# Patient Record
Sex: Female | Born: 1960 | Race: White | Hispanic: No | Marital: Married | Smoking: Never smoker
Health system: Southern US, Community
[De-identification: ages and names within clinical notes are randomized; demographics above are authoritative.]

## PROBLEM LIST (undated history)

## (undated) DIAGNOSIS — K219 Gastro-esophageal reflux disease without esophagitis: Secondary | ICD-10-CM

## (undated) DIAGNOSIS — Z87442 Personal history of urinary calculi: Secondary | ICD-10-CM

## (undated) DIAGNOSIS — T8859XA Other complications of anesthesia, initial encounter: Secondary | ICD-10-CM

## (undated) DIAGNOSIS — T4145XA Adverse effect of unspecified anesthetic, initial encounter: Secondary | ICD-10-CM

## (undated) DIAGNOSIS — D34 Benign neoplasm of thyroid gland: Secondary | ICD-10-CM

## (undated) DIAGNOSIS — C801 Malignant (primary) neoplasm, unspecified: Secondary | ICD-10-CM

## (undated) DIAGNOSIS — D649 Anemia, unspecified: Secondary | ICD-10-CM

## (undated) DIAGNOSIS — M8580 Other specified disorders of bone density and structure, unspecified site: Secondary | ICD-10-CM

## (undated) DIAGNOSIS — K589 Irritable bowel syndrome without diarrhea: Secondary | ICD-10-CM

## (undated) DIAGNOSIS — N200 Calculus of kidney: Secondary | ICD-10-CM

## (undated) DIAGNOSIS — T7840XA Allergy, unspecified, initial encounter: Secondary | ICD-10-CM

## (undated) DIAGNOSIS — M810 Age-related osteoporosis without current pathological fracture: Secondary | ICD-10-CM

## (undated) DIAGNOSIS — A64 Unspecified sexually transmitted disease: Secondary | ICD-10-CM

## (undated) HISTORY — PX: UPPER GI ENDOSCOPY: SHX6162

## (undated) HISTORY — DX: Anemia, unspecified: D64.9

## (undated) HISTORY — DX: Unspecified sexually transmitted disease: A64

## (undated) HISTORY — PX: COLONOSCOPY: SHX5424

## (undated) HISTORY — DX: Calculus of kidney: N20.0

## (undated) HISTORY — PX: OTHER SURGICAL HISTORY: SHX169

## (undated) HISTORY — PX: WISDOM TOOTH EXTRACTION: SHX21

## (undated) HISTORY — DX: Other specified disorders of bone density and structure, unspecified site: M85.80

## (undated) HISTORY — DX: Irritable bowel syndrome, unspecified: K58.9

## (undated) HISTORY — DX: Allergy, unspecified, initial encounter: T78.40XA

## (undated) HISTORY — DX: Gastro-esophageal reflux disease without esophagitis: K21.9

## (undated) HISTORY — DX: Age-related osteoporosis without current pathological fracture: M81.0

---
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
## 2017-07-20 ENCOUNTER — Encounter: Payer: Self-pay | Admitting: Internal Medicine

## 2017-08-30 ENCOUNTER — Encounter: Payer: Self-pay | Admitting: Internal Medicine

## 2017-08-30 ENCOUNTER — Other Ambulatory Visit (INDEPENDENT_AMBULATORY_CARE_PROVIDER_SITE_OTHER): Payer: Managed Care, Other (non HMO)

## 2017-08-30 ENCOUNTER — Ambulatory Visit (INDEPENDENT_AMBULATORY_CARE_PROVIDER_SITE_OTHER): Payer: Managed Care, Other (non HMO) | Admitting: Internal Medicine

## 2017-08-30 VITALS — BP 114/70 | HR 76 | Ht 61.0 in | Wt 109.0 lb

## 2017-08-30 DIAGNOSIS — Z1211 Encounter for screening for malignant neoplasm of colon: Secondary | ICD-10-CM | POA: Diagnosis not present

## 2017-08-30 DIAGNOSIS — R1013 Epigastric pain: Secondary | ICD-10-CM | POA: Diagnosis not present

## 2017-08-30 DIAGNOSIS — K580 Irritable bowel syndrome with diarrhea: Secondary | ICD-10-CM | POA: Diagnosis not present

## 2017-08-30 LAB — IGA: IGA: 81 mg/dL (ref 68–378)

## 2017-08-30 MED ORDER — HYOSCYAMINE SULFATE 0.125 MG SL SUBL
0.1250 mg | SUBLINGUAL_TABLET | SUBLINGUAL | 6 refills | Status: DC | PRN
Start: 2017-08-30 — End: 2018-10-10

## 2017-08-30 NOTE — Progress Notes (Signed)
HISTORY OF PRESENT ILLNESS:  Casey Bauer is a pleasant 57 y.o. 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 Linda Hedges for evaluation of chronic abdominal pain. Outside office note dated 07/17/2017 reviewed. No relevant x-rays were laboratories in the electronic medical record. The patient reports to me a greater than 10 year history of problems with epigastric pain. The discomfort may be focal or radiate in a bandlike distribution across the top of the abdomen. The discomfort is present constantly while awake but varies in intensity. Most prominent in the mornings. Said to be lessened after eating or lying down. NSAIDs also help though she uses these infrequently. No nocturnal component. She tells me that she was evaluated for this problem while living in [place]. At age 52 she reports undergoing both colonoscopy and upper endoscopy. No records for review and she cannot recall who/were those examinations were performed. However, she does report that they were normal or unremarkable. She was given the diagnosis of GERD and placed on "medicine" which did not help. She subsequently stopped the medicine. I suspect it was a PPI. In any event, she denies heartburn, regurgitation, dysphagia, or water brash. Her weight has been stable. She is concerned over the possibility of gallbladder disease as there is a strong family history of such. She cannot remember having had gallbladder imaging performed. She denies that she has significant stress or that stress affects the discomfort. In terms of irritable bowel her bowel habits typically regular though she will have postprandial abdominal cramping with urgency and diarrhea 2 or 3 days per month. Issues with her bowels have no effect on her epigastric and upper abdominal discomfort. She denies a family history of colon cancer or inflammatory bowel disease. GI review of systems is  otherwise negative. She does not smoke or use alcohol.  REVIEW OF SYSTEMS:  All non-GI ROS negative unless otherwise noted in history of present illness except for seasonal allergies, urinary leakage  Past Medical History:  Diagnosis Date  . GERD (gastroesophageal reflux disease)   . IBS (irritable bowel syndrome)   . Kidney stones   . Osteopenia   . Osteoporosis   . STD (female)     Past Surgical History:  Procedure Laterality Date  . ARM SURGERY Right     Social History Casey Bauer  reports that she has never smoked. She has never used smokeless tobacco. She reports that she drinks alcohol. She reports that she does not use drugs.  family history includes Asthma in her father; Bone cancer in her paternal grandmother; COPD in her father; Gallbladder disease in her mother, sister, and sister; Heart failure in her father.  No Known Allergies     PHYSICAL EXAMINATION: Vital signs: BP 114/70 (BP Location: Left Arm, Patient Position: Sitting, Cuff Size: Normal)   Pulse 76   Ht 5\' 1"  (1.549 m) Comment: height measured without shoes  Wt 109 lb (49.4 kg)   BMI 20.60 kg/m   Constitutional: generally well-appearing, no acute distress Psychiatric: alert and oriented x3, cooperative Eyes: extraocular movements intact, anicteric, conjunctiva pink Mouth: oral pharynx moist, no lesions Neck: supple  Lymph:no lymphadenopathy Cardiovascular: heart regular rate and rhythm, no murmur Lungs: clear to auscultation bilaterally Abdomen: soft, nontender, nondistended, no obvious ascites, no peritoneal signs, normal bowel sounds, no organomegaly Rectal:omitted Extremities: no clubbing cyanosis or lower extremity edema bilaterally Skin: no lesions on visible extremities Neuro: No focal deficits. Cranial  nerves intact  ASSESSMENT:  #1. Chronic epigastric pain. Prior workup elsewhere as described. Given negative workup elsewhere and failure to respond to sounds like PPI, acid peptic  disorders seem less likely. No alarm features. Could be functional dyspepsia. Rule out celiac disease. Rule out gallbladder disease #2. Diarrhea predominant irritable bowel syndrome. Rule out celiac disease. Stable #3. Reports negative colonoscopy age 60   PLAN:  #1. Screen for celiac disease with tissue transglutaminase antibody IgA and serum IgA level #2. Schedule abdominal ultrasound to rule out gallbladder disease #3. If ultrasound negative consider repeat EGD (last exam reported 8 years ago) and/or HIDA scan #4. Prescribed Levsin sublingual for possible functional dyspepsia or spasm #5. Would be due for routine screening colonoscopy 10 years from prior exam or age 8. We have placed recall for repeat screening colonoscopy in the system #6.Further recommendations to follow after the above completed and results available  A copy of this consultation note has been sent to Dr. Lynnette Caffey

## 2017-08-30 NOTE — Patient Instructions (Addendum)
You have been scheduled for an abdominal ultrasound at [facility] (1st floor of hospital) on 09/04/2017 at 9:00am. Please arrive 15 minutes prior to your appointment for registration. Make certain not to have anything to eat or drink after midnight prior to your appointment. Should you need to reschedule your appointment, please contact radiology at [phone]. This test typically takes about 30 minutes to perform.   We have sent the following medications to your pharmacy for you to pick up at your convenience:  [facility] provider has requested that you go to the basement level for lab work before leaving today. Press "B" on the elevator. The lab is located at the first door on the left as you exit the elevator.

## 2017-08-31 LAB — TISSUE TRANSGLUTAMINASE, IGA: (tTG) Ab, IgA: 1 U/mL

## 2017-09-04 ENCOUNTER — Ambulatory Visit (HOSPITAL_COMMUNITY)
Admission: RE | Admit: 2017-09-04 | Discharge: 2017-09-04 | Disposition: A | Discharge status: Home / Self Care | Payer: Managed Care, Other (non HMO) | Source: Ambulatory Visit | Attending: Internal Medicine | Admitting: Internal Medicine

## 2017-09-04 DIAGNOSIS — N200 Calculus of kidney: Secondary | ICD-10-CM | POA: Diagnosis not present

## 2017-09-04 DIAGNOSIS — K8020 Calculus of gallbladder without cholecystitis without obstruction: Secondary | ICD-10-CM | POA: Insufficient documentation

## 2017-09-04 DIAGNOSIS — R1013 Epigastric pain: Secondary | ICD-10-CM

## 2017-09-04 IMAGING — US US ABDOMEN COMPLETE
1 series · 13 of 25 positions shown · non-contrast
Comparison: None.

CLINICAL DATA: Chronic epigastric abdominal pain.

EXAM:
ABDOMEN ULTRASOUND COMPLETE

[Series 1: us abdomen complete · 13 of 100 slices shown]
[im 1/100]
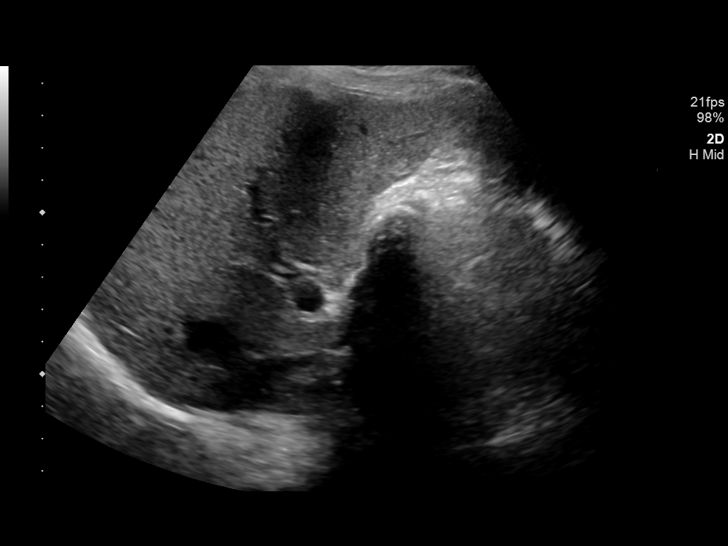
[im 9/100]
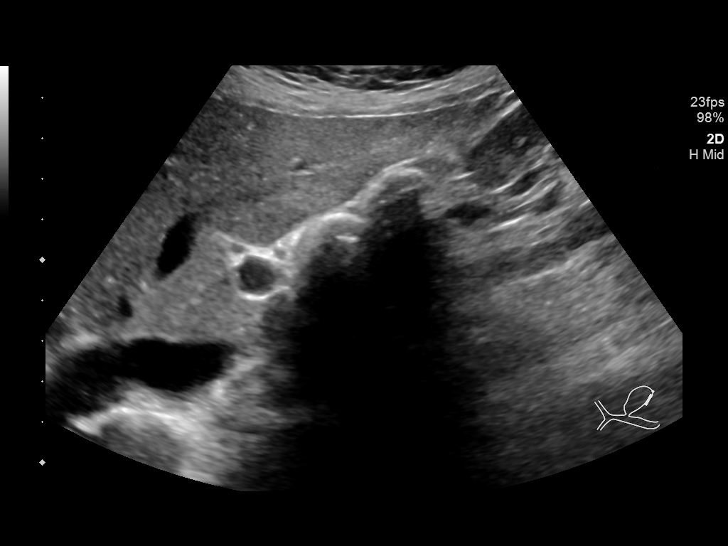
[im 17/100]
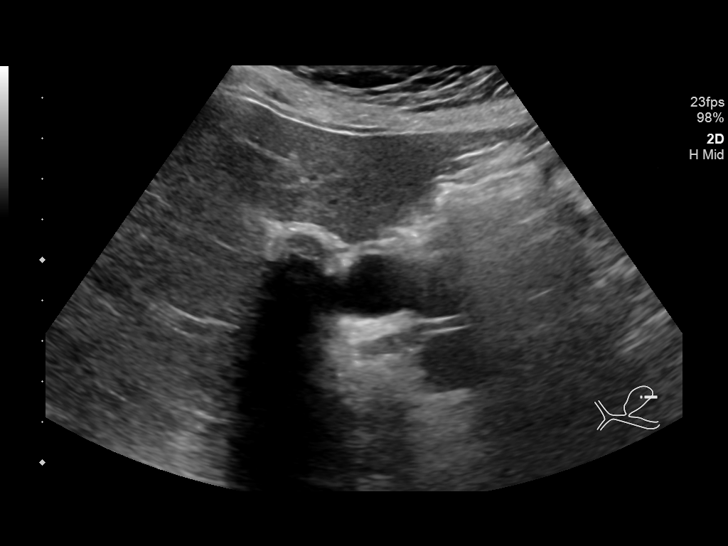
[im 25/100]
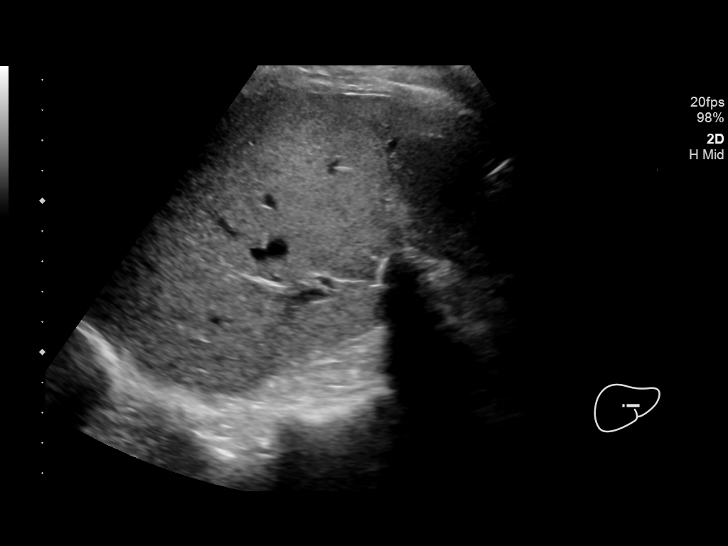
[im 34/100]
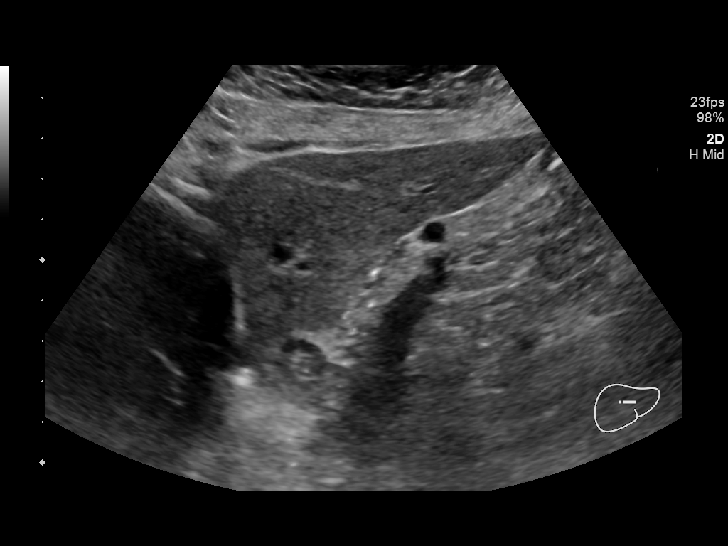
[im 42/100]
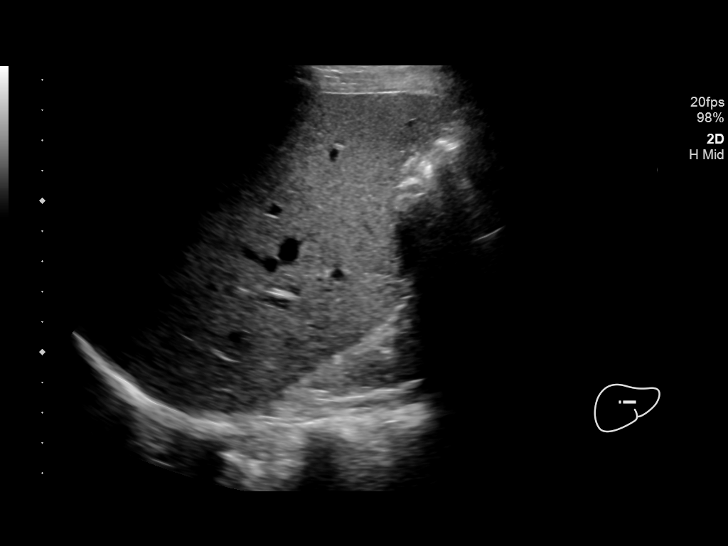
[im 50/100]
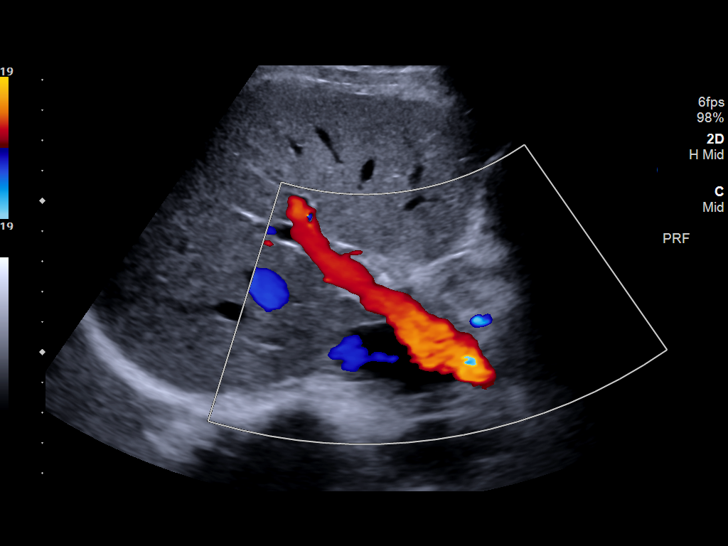
[im 58/100]
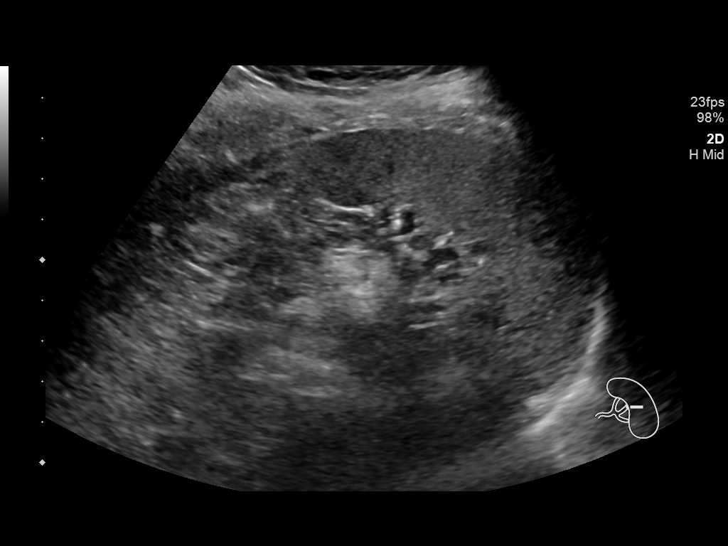
[im 67/100]
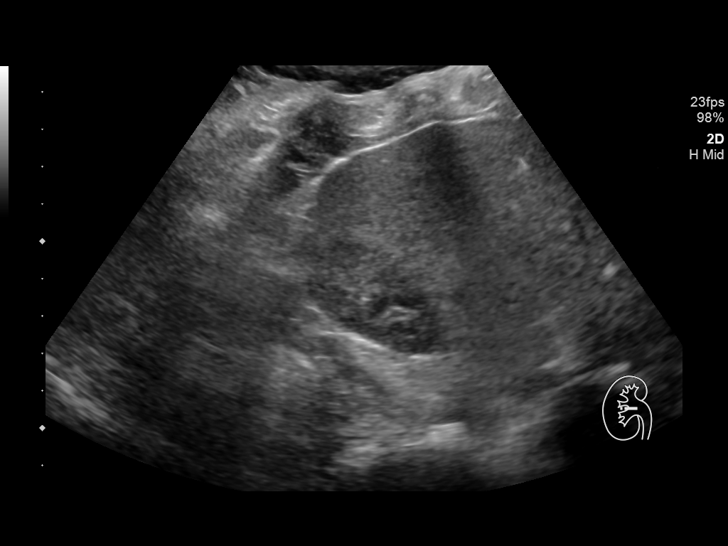
[im 75/100]
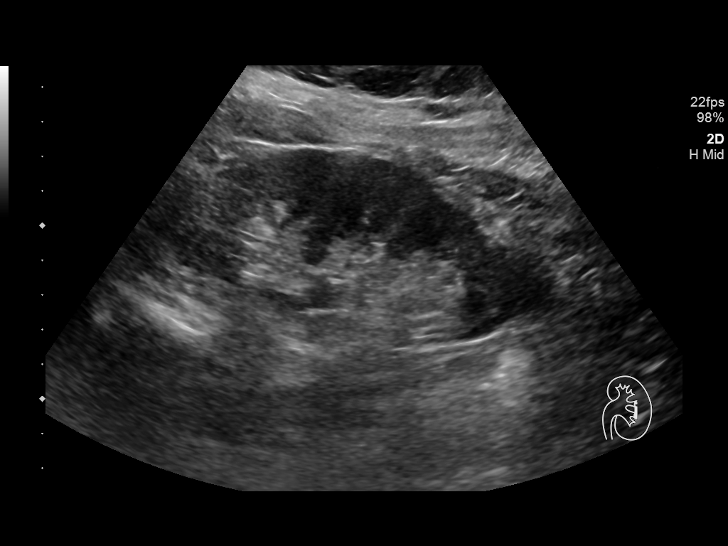
[im 83/100]
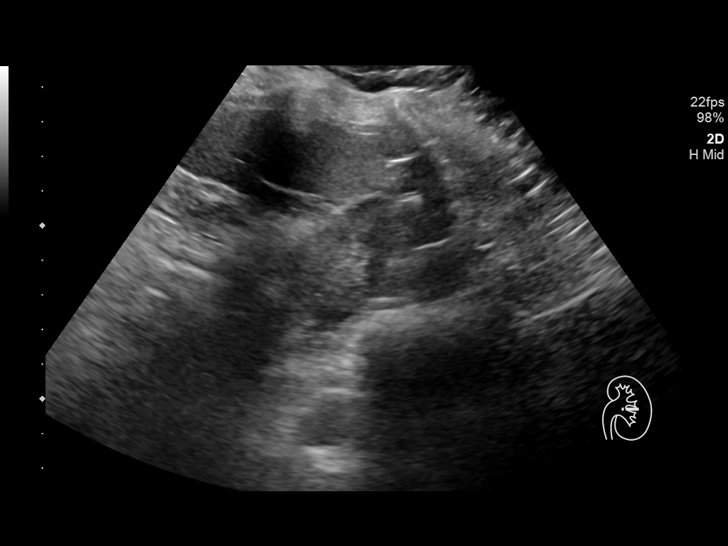
[im 91/100]
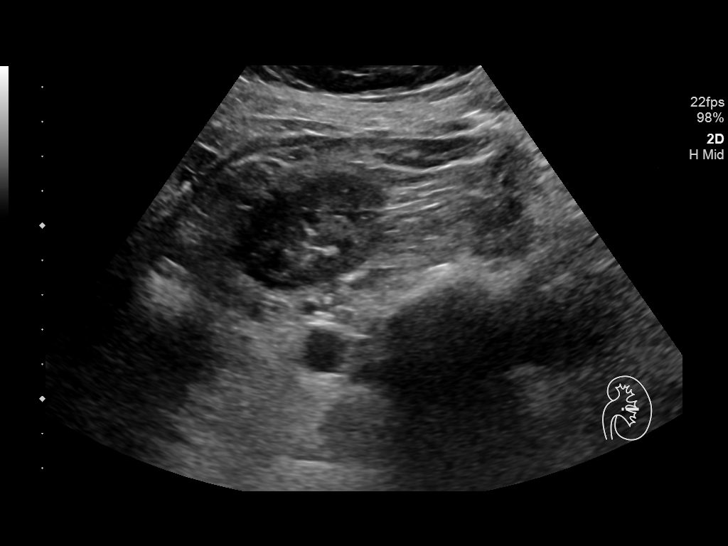
[im 100/100]
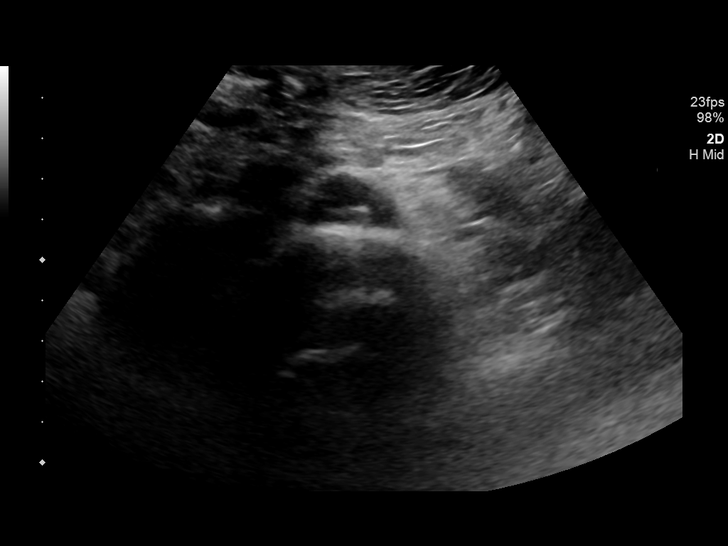

[13 of 25 positions shown; findings below may reference images not displayed]

FINDINGS: Gallbladder: Multiple gallstones are seen, largest measuring
approximately 1.6 cm. Gallbladder is contracted around the stones,
which limits evaluation of the gallbladder wall, however there is no
definite evidence of gallbladder wall thickening or pericholecystic
fluid. No sonographic Murphy sign noted by sonographer.

Common bile duct: Diameter: 3 mm, within normal limits.

Liver: No focal lesion identified. Within normal limits in
parenchymal echogenicity. Portal vein is patent on color Doppler
imaging with normal direction of blood flow towards the liver.

IVC: No abnormality visualized.

Pancreas: Visualized portion unremarkable.

Spleen: Size and appearance within normal limits.

Right Kidney: Length: 9.8 cm. Echogenicity within normal limits. No
mass or hydronephrosis visualized.

Left Kidney: Length: 10.2 cm. Echogenicity within normal limits.
cm shadowing calculus is seen in lower pole. No mass or
hydronephrosis visualized.

Abdominal aorta: No aneurysm visualized.

Other findings: None.
IMPRESSION: Cholelithiasis, without signs of acute cholecystitis or biliary
ductal dilatation.

1.5 cm nonobstructive left renal calculus.

## 2017-09-13 ENCOUNTER — Telehealth: Payer: Self-pay | Admitting: Internal Medicine

## 2017-09-13 NOTE — Telephone Encounter (Signed)
Pt was seen in the office 08/30/17, referral was faxed to [facility] on 09/04/17. Let pt know it has only been a little over a week since referral was faxed. Call placed to [facility] to see what the status of the referral is, waiting for a call back.  Pt is scheduled to see Dr. Cherlyn Roberts with [facility] 09/20/17@11 :15am with arrival time of 10:45am. Pt aware of appt.

## 2018-10-10 ENCOUNTER — Other Ambulatory Visit: Payer: Self-pay | Admitting: General Surgery

## 2018-10-11 ENCOUNTER — Encounter (HOSPITAL_COMMUNITY): Payer: Self-pay | Admitting: *Deleted

## 2018-10-11 ENCOUNTER — Other Ambulatory Visit: Payer: Self-pay

## 2018-10-11 ENCOUNTER — Other Ambulatory Visit (HOSPITAL_COMMUNITY)
Admission: RE | Admit: 2018-10-11 | Discharge: 2018-10-11 | Disposition: A | Discharge status: Home / Self Care | Payer: Managed Care, Other (non HMO) | Source: Ambulatory Visit | Attending: General Surgery | Admitting: General Surgery

## 2018-10-11 DIAGNOSIS — Z1159 Encounter for screening for other viral diseases: Secondary | ICD-10-CM | POA: Diagnosis not present

## 2018-10-11 LAB — SARS CORONAVIRUS 2 BY RT PCR (HOSPITAL ORDER, PERFORMED IN ~~LOC~~ HOSPITAL LAB): SARS Coronavirus 2: NEGATIVE

## 2018-10-11 NOTE — Progress Notes (Signed)
Spoke with pt for pre-op call. Pt denies cardiac history, HTN or Diabetes. Pt is coming by to pick up the Pre-surgery Ensure drink, instructed pt to drink it by 6:30 AM Friday AM and she may clear liquids until 6:30 AM also. Pt voiced understanding   Pt states she had her Covid 19 test done this AM. Pt understands that she is to self guaranine until surgery.    Coronavirus Screening  Have you experienced the following symptoms:  Cough NO Fever (>100.78F) NO Runny nose NO Sore throat NO Difficulty breathing/shortness of breath NO   Have you or a family member traveled in the last 14 days and where? NO  I  Patient that hospital visitation restrictions are in effect and the importance of the restrictions.   Pt has picked up the Ensure.

## 2018-10-11 NOTE — Anesthesia Preprocedure Evaluation (Addendum)
Anesthesia Evaluation  Patient identified by MRN, date of birth, ID band Patient awake    Reviewed: Allergy & Precautions, H&P , NPO status , Patient's Chart, lab work & pertinent test results  History of Anesthesia Complications (+) PROLONGED EMERGENCE and history of anesthetic complications  Airway Mallampati: I  TM Distance: >3 FB Neck ROM: Full    Dental no notable dental hx. (+) Teeth Intact, Dental Advisory Given   Pulmonary neg pulmonary ROS,    Pulmonary exam normal breath sounds clear to auscultation       Cardiovascular Exercise Tolerance: Good negative cardio ROS Normal cardiovascular exam Rhythm:Regular Rate:Normal     Neuro/Psych negative neurological ROS  negative psych ROS   GI/Hepatic negative GI ROS, Neg liver ROS,   Endo/Other  negative endocrine ROS  Renal/GU negative Renal ROS  negative genitourinary   Musculoskeletal negative musculoskeletal ROS (+)   Abdominal   Peds negative pediatric ROS (+)  Hematology negative hematology ROS (+)   Anesthesia Other Findings   Reproductive/Obstetrics negative OB ROS                            Anesthesia Physical Anesthesia Plan  ASA: II and emergent  Anesthesia Plan: General   Post-op Pain Management:    Induction: Intravenous and Cricoid pressure planned  PONV Risk Score and Plan: Treatment may vary due to age or medical condition, Ondansetron, Dexamethasone, Midazolam and Scopolamine patch - Pre-op  Airway Management Planned: Oral ETT  Additional Equipment:   Intra-op Plan:   Post-operative Plan: Extubation in OR  Informed Consent: I have reviewed the patients History and Physical, chart, labs and discussed the procedure including the risks, benefits and alternatives for the proposed anesthesia with the patient or authorized representative who has indicated his/her understanding and acceptance.     Dental  advisory given  Plan Discussed with: CRNA, Anesthesiologist and Surgeon  Anesthesia Plan Comments: (  )       Anesthesia Quick Evaluation

## 2018-10-12 ENCOUNTER — Ambulatory Visit (HOSPITAL_COMMUNITY): Payer: Managed Care, Other (non HMO) | Admitting: Anesthesiology

## 2018-10-12 ENCOUNTER — Encounter (HOSPITAL_COMMUNITY)
Admission: RE | Disposition: A | Discharge status: Home / Self Care | Payer: Self-pay | Source: Home / Self Care | Attending: General Surgery

## 2018-10-12 ENCOUNTER — Encounter (HOSPITAL_COMMUNITY): Payer: Self-pay | Admitting: Anesthesiology

## 2018-10-12 ENCOUNTER — Other Ambulatory Visit: Payer: Self-pay

## 2018-10-12 ENCOUNTER — Ambulatory Visit (HOSPITAL_COMMUNITY)
Admission: RE | Admit: 2018-10-12 | Discharge: 2018-10-12 | Disposition: A | Discharge status: Home / Self Care | Payer: Managed Care, Other (non HMO) | Attending: General Surgery | Admitting: General Surgery

## 2018-10-12 DIAGNOSIS — K8010 Calculus of gallbladder with chronic cholecystitis without obstruction: Secondary | ICD-10-CM | POA: Diagnosis not present

## 2018-10-12 DIAGNOSIS — R109 Unspecified abdominal pain: Secondary | ICD-10-CM | POA: Diagnosis present

## 2018-10-12 HISTORY — PX: CHOLECYSTECTOMY: SHX55

## 2018-10-12 HISTORY — DX: Other complications of anesthesia, initial encounter: T88.59XA

## 2018-10-12 HISTORY — DX: Personal history of urinary calculi: Z87.442

## 2018-10-12 LAB — CBC WITH DIFFERENTIAL/PLATELET
Abs Immature Granulocytes: 0.02 10*3/uL (ref 0.00–0.07)
Basophils Absolute: 0 10*3/uL (ref 0.0–0.1)
Basophils Relative: 1 %
Eosinophils Absolute: 0.1 10*3/uL (ref 0.0–0.5)
Eosinophils Relative: 2 %
HCT: 42.8 % (ref 36.0–46.0)
Hemoglobin: 14.6 g/dL (ref 12.0–15.0)
Immature Granulocytes: 0 %
Lymphocytes Relative: 30 %
Lymphs Abs: 1.4 10*3/uL (ref 0.7–4.0)
MCH: 31.1 pg (ref 26.0–34.0)
MCHC: 34.1 g/dL (ref 30.0–36.0)
MCV: 91.1 fL (ref 80.0–100.0)
Monocytes Absolute: 0.3 10*3/uL (ref 0.1–1.0)
Monocytes Relative: 7 %
Neutro Abs: 2.8 10*3/uL (ref 1.7–7.7)
Neutrophils Relative %: 60 %
Platelets: 250 10*3/uL (ref 150–400)
RBC: 4.7 MIL/uL (ref 3.87–5.11)
RDW: 11.7 % (ref 11.5–15.5)
WBC: 4.8 10*3/uL (ref 4.0–10.5)
nRBC: 0 % (ref 0.0–0.2)

## 2018-10-12 LAB — COMPREHENSIVE METABOLIC PANEL
ALT: 48 U/L — ABNORMAL HIGH (ref 0–44)
AST: 33 U/L (ref 15–41)
Albumin: 3.9 g/dL (ref 3.5–5.0)
Alkaline Phosphatase: 71 U/L (ref 38–126)
Anion gap: 14 (ref 5–15)
BUN: 17 mg/dL (ref 6–20)
CO2: 21 mmol/L — ABNORMAL LOW (ref 22–32)
Calcium: 8.9 mg/dL (ref 8.9–10.3)
Chloride: 107 mmol/L (ref 98–111)
Creatinine, Ser: 0.75 mg/dL (ref 0.44–1.00)
GFR calc Af Amer: >60 mL/min (ref >60)
GFR calc non Af Amer: >60 mL/min (ref >60)
Glucose, Bld: 129 mg/dL — ABNORMAL HIGH (ref 70–99)
Potassium: 3.8 mmol/L (ref 3.5–5.1)
Sodium: 142 mmol/L (ref 135–145)
Total Bilirubin: 0.8 mg/dL (ref 0.3–1.2)
Total Protein: 6.3 g/dL — ABNORMAL LOW (ref 6.5–8.1)

## 2018-10-12 SURGERY — LAPAROSCOPIC CHOLECYSTECTOMY
Anesthesia: General | Site: Abdomen

## 2018-10-12 MED ORDER — ENSURE PRE-SURGERY PO LIQD: 296.0000 mL | Freq: Once | ORAL | Status: DC

## 2018-10-12 MED ORDER — FENTANYL CITRATE (PF) 100 MCG/2ML IJ SOLN: 25.0000 ug | INTRAMUSCULAR | Status: DC | PRN

## 2018-10-12 MED ORDER — MIDAZOLAM HCL 2 MG/2ML IJ SOLN
INTRAMUSCULAR | Status: AC
  Filled 2018-10-12: qty 2

## 2018-10-12 MED ORDER — ROCURONIUM BROMIDE 10 MG/ML (PF) SYRINGE
PREFILLED_SYRINGE | INTRAVENOUS | Status: DC | PRN
  Administered 2018-10-12: 50 mg via INTRAVENOUS

## 2018-10-12 MED ORDER — ACETAMINOPHEN 500 MG PO TABS
1000.0000 mg | ORAL_TABLET | ORAL | Status: AC
  Administered 2018-10-12: 1000 mg via ORAL
  Filled 2018-10-12: qty 2

## 2018-10-12 MED ORDER — SUGAMMADEX SODIUM 200 MG/2ML IV SOLN
INTRAVENOUS | Status: DC | PRN
  Administered 2018-10-12: 200 mg via INTRAVENOUS

## 2018-10-12 MED ORDER — SUCCINYLCHOLINE CHLORIDE 200 MG/10ML IV SOSY
PREFILLED_SYRINGE | INTRAVENOUS | Status: AC
  Filled 2018-10-12: qty 10

## 2018-10-12 MED ORDER — ONDANSETRON HCL 4 MG/2ML IJ SOLN
INTRAMUSCULAR | Status: AC
  Filled 2018-10-12: qty 2

## 2018-10-12 MED ORDER — PHENYLEPHRINE 40 MCG/ML (10ML) SYRINGE FOR IV PUSH (FOR BLOOD PRESSURE SUPPORT)
PREFILLED_SYRINGE | INTRAVENOUS | Status: AC
  Filled 2018-10-12: qty 10

## 2018-10-12 MED ORDER — HEMOSTATIC AGENTS (NO CHARGE) OPTIME
TOPICAL | Status: DC | PRN
  Administered 2018-10-12: 1

## 2018-10-12 MED ORDER — GABAPENTIN 100 MG PO CAPS
100.0000 mg | ORAL_CAPSULE | ORAL | Status: AC
  Administered 2018-10-12: 08:00:00 100 mg via ORAL
  Filled 2018-10-12: qty 1

## 2018-10-12 MED ORDER — ONDANSETRON HCL 4 MG/2ML IJ SOLN
INTRAMUSCULAR | Status: DC | PRN
  Administered 2018-10-12: 4 mg via INTRAVENOUS

## 2018-10-12 MED ORDER — MEPERIDINE HCL 25 MG/ML IJ SOLN: 6.2500 mg | INTRAMUSCULAR | Status: DC | PRN

## 2018-10-12 MED ORDER — BUPIVACAINE-EPINEPHRINE (PF) 0.25% -1:200000 IJ SOLN
INTRAMUSCULAR | Status: AC
  Filled 2018-10-12: qty 30

## 2018-10-12 MED ORDER — MIDAZOLAM HCL 5 MG/5ML IJ SOLN
INTRAMUSCULAR | Status: DC | PRN
  Administered 2018-10-12 (×2): 1 mg via INTRAVENOUS

## 2018-10-12 MED ORDER — ROCURONIUM BROMIDE 10 MG/ML (PF) SYRINGE
PREFILLED_SYRINGE | INTRAVENOUS | Status: AC
  Filled 2018-10-12: qty 10

## 2018-10-12 MED ORDER — PROPOFOL 10 MG/ML IV BOLUS
INTRAVENOUS | Status: AC
  Filled 2018-10-12: qty 20

## 2018-10-12 MED ORDER — ONDANSETRON HCL 4 MG/2ML IJ SOLN: 4.0000 mg | Freq: Once | INTRAMUSCULAR | Status: DC | PRN

## 2018-10-12 MED ORDER — ONDANSETRON HCL 4 MG/2ML IJ SOLN
4.0000 mg | Freq: Once | INTRAMUSCULAR | Status: AC
  Administered 2018-10-12: 4 mg via INTRAVENOUS

## 2018-10-12 MED ORDER — DEXAMETHASONE SODIUM PHOSPHATE 10 MG/ML IJ SOLN
INTRAMUSCULAR | Status: DC | PRN
  Administered 2018-10-12: 10 mg via INTRAVENOUS

## 2018-10-12 MED ORDER — OXYCODONE HCL 5 MG/5ML PO SOLN: 5.0000 mg | Freq: Once | ORAL | Status: AC | PRN

## 2018-10-12 MED ORDER — OXYCODONE HCL 5 MG PO TABS
5.0000 mg | ORAL_TABLET | Freq: Once | ORAL | Status: AC | PRN
  Administered 2018-10-12: 5 mg via ORAL

## 2018-10-12 MED ORDER — DEXAMETHASONE SODIUM PHOSPHATE 10 MG/ML IJ SOLN
INTRAMUSCULAR | Status: AC
  Filled 2018-10-12: qty 1

## 2018-10-12 MED ORDER — ONDANSETRON HCL 4 MG/2ML IJ SOLN
INTRAMUSCULAR | Status: AC
  Administered 2018-10-12: 08:00:00 4 mg via INTRAVENOUS
  Filled 2018-10-12: qty 2

## 2018-10-12 MED ORDER — KETOROLAC TROMETHAMINE 15 MG/ML IJ SOLN
15.0000 mg | INTRAMUSCULAR | Status: DC
  Administered 2018-10-12: 15 mg via INTRAVENOUS
  Filled 2018-10-12: qty 1

## 2018-10-12 MED ORDER — OXYCODONE HCL 5 MG PO TABS
ORAL_TABLET | ORAL | Status: AC
  Filled 2018-10-12: qty 1

## 2018-10-12 MED ORDER — FENTANYL CITRATE (PF) 100 MCG/2ML IJ SOLN
INTRAMUSCULAR | Status: AC
  Filled 2018-10-12: qty 2

## 2018-10-12 MED ORDER — LACTATED RINGERS IV SOLN: INTRAVENOUS | Status: DC

## 2018-10-12 MED ORDER — STERILE WATER FOR IRRIGATION IR SOLN
Status: DC | PRN
  Administered 2018-10-12: 1000 mL

## 2018-10-12 MED ORDER — EPHEDRINE 5 MG/ML INJ
INTRAVENOUS | Status: AC
  Filled 2018-10-12: qty 10

## 2018-10-12 MED ORDER — OXYCODONE HCL 5 MG PO TABS: 5.0000 mg | ORAL_TABLET | Freq: Four times a day (QID) | ORAL | 0 refills | Status: DC | PRN

## 2018-10-12 MED ORDER — FENTANYL CITRATE (PF) 250 MCG/5ML IJ SOLN
INTRAMUSCULAR | Status: AC
  Filled 2018-10-12: qty 5

## 2018-10-12 MED ORDER — SODIUM CHLORIDE 0.9 % IR SOLN
Status: DC | PRN
  Administered 2018-10-12: 1000 mL

## 2018-10-12 MED ORDER — SCOPOLAMINE 1 MG/3DAYS TD PT72
MEDICATED_PATCH | TRANSDERMAL | Status: AC
  Filled 2018-10-12: qty 1

## 2018-10-12 MED ORDER — FENTANYL CITRATE (PF) 250 MCG/5ML IJ SOLN
INTRAMUSCULAR | Status: DC | PRN
  Administered 2018-10-12: 50 ug via INTRAVENOUS

## 2018-10-12 MED ORDER — LIDOCAINE 2% (20 MG/ML) 5 ML SYRINGE
INTRAMUSCULAR | Status: AC
  Filled 2018-10-12: qty 5

## 2018-10-12 MED ORDER — SCOPOLAMINE 1 MG/3DAYS TD PT72
1.0000 | MEDICATED_PATCH | TRANSDERMAL | Status: DC
  Administered 2018-10-12: 1.5 mg via TRANSDERMAL

## 2018-10-12 MED ORDER — ACETAMINOPHEN 160 MG/5ML PO SOLN: 325.0000 mg | ORAL | Status: DC | PRN

## 2018-10-12 MED ORDER — PROPOFOL 10 MG/ML IV BOLUS
INTRAVENOUS | Status: DC | PRN
  Administered 2018-10-12: 100 mg via INTRAVENOUS

## 2018-10-12 MED ORDER — CEFAZOLIN SODIUM-DEXTROSE 2-4 GM/100ML-% IV SOLN
INTRAVENOUS | Status: AC
  Filled 2018-10-12: qty 100

## 2018-10-12 MED ORDER — ACETAMINOPHEN 325 MG PO TABS: 325.0000 mg | ORAL_TABLET | ORAL | Status: DC | PRN

## 2018-10-12 MED ORDER — LIDOCAINE 2% (20 MG/ML) 5 ML SYRINGE
INTRAMUSCULAR | Status: DC | PRN
  Administered 2018-10-12: 60 mg via INTRAVENOUS

## 2018-10-12 MED ORDER — BUPIVACAINE-EPINEPHRINE 0.25% -1:200000 IJ SOLN
INTRAMUSCULAR | Status: DC | PRN
  Administered 2018-10-12: 10 mL

## 2018-10-12 MED ORDER — SUCCINYLCHOLINE CHLORIDE 200 MG/10ML IV SOSY
PREFILLED_SYRINGE | INTRAVENOUS | Status: DC | PRN
  Administered 2018-10-12: 100 mg via INTRAVENOUS

## 2018-10-12 MED ORDER — 0.9 % SODIUM CHLORIDE (POUR BTL) OPTIME
TOPICAL | Status: DC | PRN
  Administered 2018-10-12: 1000 mL

## 2018-10-12 MED ORDER — CEFAZOLIN SODIUM-DEXTROSE 2-4 GM/100ML-% IV SOLN
2.0000 g | INTRAVENOUS | Status: AC
  Administered 2018-10-12: 2 g via INTRAVENOUS
  Filled 2018-10-12: qty 100

## 2018-10-12 MED ORDER — LACTATED RINGERS IV SOLN
INTRAVENOUS | Status: DC
  Administered 2018-10-12 (×2): via INTRAVENOUS

## 2018-10-12 SURGICAL SUPPLY — 39 items
APPLIER CLIP 5 13 M/L LIGAMAX5 (MISCELLANEOUS) ×3
BLADE CLIPPER SURG (BLADE) IMPLANT
CANISTER SUCT 3000ML PPV (MISCELLANEOUS) ×3 IMPLANT
CHLORAPREP W/TINT 26 (MISCELLANEOUS) ×3 IMPLANT
CLIP APPLIE 5 13 M/L LIGAMAX5 (MISCELLANEOUS) ×1 IMPLANT
CLOSURE WOUND 1/2 X4 (GAUZE/BANDAGES/DRESSINGS) ×1
COVER SURGICAL LIGHT HANDLE (MISCELLANEOUS) ×3 IMPLANT
COVER WAND RF STERILE (DRAPES) ×3 IMPLANT
DERMABOND ADVANCED (GAUZE/BANDAGES/DRESSINGS) ×2
DERMABOND ADVANCED .7 DNX12 (GAUZE/BANDAGES/DRESSINGS) ×1 IMPLANT
ELECT REM PT RETURN 9FT ADLT (ELECTROSURGICAL) ×3
ELECTRODE REM PT RTRN 9FT ADLT (ELECTROSURGICAL) ×1 IMPLANT
GLOVE BIO SURGEON STRL SZ7 (GLOVE) ×3 IMPLANT
GLOVE BIOGEL PI IND STRL 7.5 (GLOVE) ×1 IMPLANT
GLOVE BIOGEL PI INDICATOR 7.5 (GLOVE) ×2
GOWN STRL REUS W/ TWL LRG LVL3 (GOWN DISPOSABLE) ×3 IMPLANT
GOWN STRL REUS W/TWL LRG LVL3 (GOWN DISPOSABLE) ×6
GRASPER SUT TROCAR 14GX15 (MISCELLANEOUS) ×3 IMPLANT
HEMOSTAT SNOW SURGICEL 2X4 (HEMOSTASIS) ×3 IMPLANT
KIT BASIN OR (CUSTOM PROCEDURE TRAY) ×3 IMPLANT
KIT TURNOVER KIT B (KITS) ×3 IMPLANT
NS IRRIG 1000ML POUR BTL (IV SOLUTION) ×3 IMPLANT
PAD ARMBOARD 7.5X6 YLW CONV (MISCELLANEOUS) ×3 IMPLANT
POUCH RETRIEVAL ECOSAC 10 (ENDOMECHANICALS) ×1 IMPLANT
POUCH RETRIEVAL ECOSAC 10MM (ENDOMECHANICALS) ×2
SCISSORS LAP 5X35 DISP (ENDOMECHANICALS) ×3 IMPLANT
SET IRRIG TUBING LAPAROSCOPIC (IRRIGATION / IRRIGATOR) ×3 IMPLANT
SET TUBE SMOKE EVAC HIGH FLOW (TUBING) ×3 IMPLANT
SLEEVE ENDOPATH XCEL 5M (ENDOMECHANICALS) ×6 IMPLANT
SPECIMEN JAR SMALL (MISCELLANEOUS) ×3 IMPLANT
STRIP CLOSURE SKIN 1/2X4 (GAUZE/BANDAGES/DRESSINGS) ×2 IMPLANT
SUT MNCRL AB 4-0 PS2 18 (SUTURE) ×3 IMPLANT
SUT VICRYL 0 UR6 27IN ABS (SUTURE) ×3 IMPLANT
TOWEL OR 17X24 6PK STRL BLUE (TOWEL DISPOSABLE) ×3 IMPLANT
TOWEL OR 17X26 10 PK STRL BLUE (TOWEL DISPOSABLE) ×3 IMPLANT
TRAY LAPAROSCOPIC MC (CUSTOM PROCEDURE TRAY) ×3 IMPLANT
TROCAR XCEL BLUNT TIP 100MML (ENDOMECHANICALS) ×3 IMPLANT
TROCAR XCEL NON-BLD 5MMX100MML (ENDOMECHANICALS) ×3 IMPLANT
WATER STERILE IRR 1000ML POUR (IV SOLUTION) ×3 IMPLANT

## 2018-10-12 NOTE — Interval H&P Note (Signed)
History and Physical Interval Note:  10/12/2018 9:11 AM  Casey Bauer  has presented today for surgery, with the diagnosis of GALLSTONES.  The various methods of treatment have been discussed with the patient and family. After consideration of risks, benefits and other options for treatment, the patient has consented to  Procedure(s) with comments: LAPAROSCOPIC CHOLECYSTECTOMY (N/A) - TAP BLOCK as a surgical intervention.  The patient's history has been reviewed, patient examined, no change in status, stable for surgery.  I have reviewed the patient's chart and labs.  Questions were answered to the patient's satisfaction.     Rolm Bookbinder

## 2018-10-12 NOTE — Anesthesia Postprocedure Evaluation (Signed)
Anesthesia Post Note  Patient: Casey Bauer  Procedure(s) Performed: LAPAROSCOPIC CHOLECYSTECTOMY (N/A Abdomen)     Patient location during evaluation: PACU Anesthesia Type: General Level of consciousness: awake and alert Pain management: pain level controlled Vital Signs Assessment: post-procedure vital signs reviewed and stable Respiratory status: spontaneous breathing, nonlabored ventilation, respiratory function stable and patient connected to nasal cannula oxygen Cardiovascular status: blood pressure returned to baseline and stable Postop Assessment: no apparent nausea or vomiting Anesthetic complications: no    Last Vitals:  Vitals:   10/12/18 0905 10/12/18 1037  BP: (!) 102/55 109/73  Pulse: 70 79  Resp: 16 16  Temp:  (!) 36.1 C  SpO2: 99% 100%    Last Pain:  Vitals:   10/12/18 1037  TempSrc:   PainSc: 0-No pain                 Renell Allum

## 2018-10-12 NOTE — Transfer of Care (Signed)
Immediate Anesthesia Transfer of Care Note  Patient: Casey Bauer  Procedure(s) Performed: LAPAROSCOPIC CHOLECYSTECTOMY (N/A Abdomen)  Patient Location: PACU  Anesthesia Type:General  Level of Consciousness: awake, alert  and oriented  Airway & Oxygen Therapy: Patient Spontanous Breathing and Patient connected to face mask oxygen  Post-op Assessment: Report given to RN and Post -op Vital signs reviewed and stable  Post vital signs: Reviewed and stable  Last Vitals:  Vitals Value Taken Time  BP 109/73 10/12/2018 10:37 AM  Temp 36.1 C 10/12/2018 10:37 AM  Pulse 80 10/12/2018 10:42 AM  Resp 15 10/12/2018 10:42 AM  SpO2 96 % 10/12/2018 10:42 AM  Vitals shown include unvalidated device data.  Last Pain:  Vitals:   10/12/18 1037  TempSrc:   PainSc: 0-No pain      Patients Stated Pain Goal: 2 (00/17/49 4496)  Complications: No apparent anesthesia complications

## 2018-10-12 NOTE — Discharge Instructions (Signed)
[facility] LAPAROSCOPIC SURGERY: POST OP INSTRUCTIONS Can email me at [email or web address] with nonurgent questions Take 400 mg of ibuprofen every 8 hours or 650 mg tylenol every 6 hours for next 72 hours then as needed. Use ice several times daily on bellybutton. Always review your discharge instruction sheet given to you by the facility where your surgery was performed. IF YOU HAVE DISABILITY OR FAMILY LEAVE FORMS, YOU MUST BRING THEM TO THE OFFICE FOR PROCESSING.   DO NOT GIVE THEM TO YOUR DOCTOR.  1. A prescription for pain medication may be given to you upon discharge.  Take your pain medication as prescribed, if needed.  If narcotic pain medicine is not needed, then you may take acetaminophen (Tylenol), naprosyn (Alleve), or ibuprofen (Advil) as needed. 2. Take your usually prescribed medications unless otherwise directed. 3. If you need a refill on your pain medication, please contact your pharmacy.  They will contact our office to request authorization. Prescriptions will not be filled after 5pm or on week-ends. 4. You should follow a light diet the first few days after arrival home, such as soup and crackers, etc.  Be sure to include lots of fluids daily. 5. Most patients will experience some swelling and bruising in the area of the incisions.  Ice packs will help.  Swelling and bruising can take several days to resolve.  6. It is common to experience some constipation if taking pain medication after surgery.  Increasing fluid intake and taking a stool softener (such as Colace) will usually help or prevent this problem from occurring.  A mild laxative (Milk of Magnesia or Miralax) should be taken according to package instructions if there are no bowel movements after 48 hours. 7. Unless discharge instructions indicate otherwise, you may remove your bandages 48 hours after surgery, and you may shower at that time.  You may  have steri-strips (small skin tapes) in place directly over the incision.  These strips should be left on the skin for 7-10 days.  If your surgeon used skin glue on the incision, you may shower in 24 hours.  The glue will flake off over the next 2-3 weeks.  Any sutures or staples will be removed at the office during your follow-up visit. 8. ACTIVITIES:  You may resume regular (light) daily activities beginning the next day--such as daily self-care, walking, climbing stairs--gradually increasing activities as tolerated.  You may have sexual intercourse when it is comfortable.  Refrain from any heavy lifting or straining until approved by your doctor. a. You may drive when you are no longer taking prescription pain medication, you can comfortably wear a seatbelt, and you can safely maneuver your car and apply brakes. b. RETURN TO WORK:  __________________________________________________________ 9. You should see your doctor in the office for a follow-up appointment approximately 2-3 weeks after your surgery.  Make sure that you call for this appointment within a day or two after you arrive home to insure a convenient appointment time. 10. OTHER INSTRUCTIONS: __________________________________________________________________________________________________________________________ __________________________________________________________________________________________________________________________ WHEN TO CALL YOUR DOCTOR: 1. Fever over 101.0 2. Inability to urinate 3. Continued bleeding from incision. 4. Increased pain, redness, or drainage from the incision. 5. Increasing abdominal pain  The clinic staff is available to answer your questions during regular business hours.  Please dont hesitate to call and ask to speak to one of the nurses for clinical concerns.  If you have a medical emergency, go to the nearest emergency room or call 911.  A surgeon from  [facility] is always on call at  the hospital. [address] [place], [place], [place]  [address] ? [address], [place], [place]   [address] [phone] ? [phone] ? FAX [phone] Web site: [email or web address]

## 2018-10-12 NOTE — Progress Notes (Signed)
During IV start, patient noted "seeing black and feel like about to pass out". Stated that it has happened before. Patient loss consciousness, sternal rubbed, cool cloth given, return of consciousness. Dr. Ambrose Pancoast in room. BP 127/96. Pt. Stated feeling nauseated about to throw up. Emesis thin clear,tinged green 250cc. No pain. Orders given. Will continue to monitor.

## 2018-10-12 NOTE — Anesthesia Procedure Notes (Signed)
Procedure Name: Intubation Date/Time: 10/12/2018 9:39 AM Performed by: Marsa Aris, CRNA Pre-anesthesia Checklist: Patient identified, Emergency Drugs available, Suction available and Patient being monitored Patient Re-evaluated:Patient Re-evaluated prior to induction Oxygen Delivery Method: Circle System Utilized Preoxygenation: Pre-oxygenation with 100% oxygen Induction Type: IV induction, Rapid sequence and Cricoid Pressure applied Laryngoscope Size: Miller and 2 Grade View: Grade I Tube type: Oral Tube size: 7.0 mm Number of attempts: 1 Airway Equipment and Method: Stylet and Oral airway Placement Confirmation: ETT inserted through vocal cords under direct vision,  positive ETCO2 and breath sounds checked- equal and bilateral Secured at: 21 cm Tube secured with: Tape Dental Injury: Teeth and Oropharynx as per pre-operative assessment  Comments: No change in dentition from pre-procedure

## 2018-10-12 NOTE — H&P (Signed)
58 year old female who presents with non-malignant abdominal pain. Referred by Scarlette Shorts. The patient has a 4 year history of epigastric pain. It is intermittent. It happens mostly in the morning especially when she has noticed stomach. She finds that eating helps the pain. She has had endoscopies in the past which did not show any abnormalities. She started taking Nexium which did not help the pain. She has some IBS symptoms and has bowel movements once a day but it is intermittently loose. She denies nausea or vomiting with pain. She presented to chiropractic occasionally for the pain. Currently she does not take anything for pain.   Diagnostic Studies History Colonoscopy  5-10 years ago Mammogram  >3 years ago Pap Smear  1-5 years ago  Allergies  No Known Allergies  Allergies Reconciled   Medication History  Alendronate Sodium (10MG  Tablet, Oral) Active. ValACYclovir HCl (500MG  Tablet, Oral) Active. Medications Reconciled  Social History Alcohol use  Occasional alcohol use. Caffeine use  Carbonated beverages, Tea. No drug use  Tobacco use  Never smoker.  Family History  First Degree Relatives  No pertinent family history   Pregnancy / Birth History  Age at menarche  1 years. Age of menopause  [age] Contraceptive History  Oral contraceptives. Gravida  4 Length (months) of breastfeeding  3-6 Maternal age  62-40 Para  1 Regular periods   Other Problems  Bladder Problems  Cholelithiasis  Kidney Stone    Review of Systems General Not Present- Appetite Loss, Chills, Fatigue, Fever, Night Sweats, Weight Gain and Weight Loss. Skin Not Present- Change in Wart/Mole, Dryness, Hives, Jaundice, New Lesions, Non-Healing Wounds, Rash and Ulcer. HEENT Present- Seasonal Allergies. Not Present- Earache, Hearing Loss, Hoarseness, Nose Bleed, Oral Ulcers, Ringing in the Ears, Sinus Pain, Sore Throat, Visual Disturbances, Wears glasses/contact  lenses and Yellow Eyes. Respiratory Not Present- Bloody sputum, Chronic Cough, Difficulty Breathing, Snoring and Wheezing. Breast Not Present- Breast Mass, Breast Pain, Nipple Discharge and Skin Changes. Cardiovascular Not Present- Chest Pain, Difficulty Breathing Lying Down, Leg Cramps, Palpitations, Rapid Heart Rate, Shortness of Breath and Swelling of Extremities. Gastrointestinal Present- Abdominal Pain. Not Present- Bloating, Bloody Stool, Change in Bowel Habits, Chronic diarrhea, Constipation, Difficulty Swallowing, Excessive gas, Gets full quickly at meals, Hemorrhoids, Indigestion, Nausea, Rectal Pain and Vomiting. Female Genitourinary Present- Frequency and Urgency. Not Present- Nocturia, Painful Urination and Pelvic Pain. Musculoskeletal Not Present- Back Pain, Joint Pain, Joint Stiffness, Muscle Pain, Muscle Weakness and Swelling of Extremities. Neurological Not Present- Decreased Memory, Fainting, Headaches, Numbness, Seizures, Tingling, Tremor, Trouble walking and Weakness. Psychiatric Not Present- Anxiety, Bipolar, Change in Sleep Pattern, Depression, Fearful and Frequent crying. Endocrine Not Present- Cold Intolerance, Excessive Hunger, Hair Changes, Heat Intolerance, Hot flashes and New Diabetes. Hematology Not Present- Blood Thinners, Easy Bruising, Excessive bleeding, Gland problems, HIV and Persistent Infections.   Physical Exam  General Mental Status-Alert. General Appearance-Cooperative. Orientation-Oriented X4. Posture-Normal posture. Integumentary Global Assessment Normal Exam - Head/Face: no rashes, ulcers, lesions or evidence of photo damage. No palpable nodules or masses and Neck: no visible lesions or palpable masses. Head and Neck Head-normocephalic, atraumatic with no lesions or palpable masses. Face Global Assessment - atraumatic. Thyroid Gland Characteristics - normal size and consistency. Eye Eyeball - Bilateral-Extraocular movements  intact. Sclera/Conjunctiva - Bilateral-No scleral icterus, No Discharge. ENMT Nose and Sinuses Nose - no deformities observed, no swelling present. Chest and Lung Exam Palpation Normal exam - Non-tender. Auscultation Breath sounds - Normal. Cardiovascular Auscultation Rhythm - Regular. Heart Sounds -  S1 WNL and S2 WNL. Carotid arteries - No Carotid bruit. Abdomen Inspection Normal Exam - No Visible peristalsis, No Abnormal pulsations and No Paradoxical movements. Palpation/Percussion Normal exam - Soft, No Rebound tenderness, No Rigidity (guarding), No hepatosplenomegaly and No Palpable abdominal masses. Gallbladder - Negative Murphy's sign. Peripheral Vascular Upper Extremity Palpation - Pulses bilaterally normal. Lower Extremity Palpation - Edema - Bilateral - No edema. Neurologic Neurologic evaluation reveals -normal sensation and normal coordination. Neuropsychiatric Mental status exam performed with findings of-able to articulate well with normal speech/language, rate, volume and coherence and thought content normal with ability to perform basic computations and apply abstract reasoning. Musculoskeletal Normal Exam - Bilateral-Upper Extremity Strength Normal and Lower Extremity Strength Normal.  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  The likelihood of improvement in symptoms and return to the patient's normal status is good. We discussed the typical post-operative recovery course.

## 2018-10-12 NOTE — Op Note (Signed)
Preoperative diagnosis:biliary colic Postoperative diagnosis: same as above Procedure: Laparoscopic cholecystectomy Surgeon: Dr. Serita Grammes Anesthesia: General Estimated blood loss: Minimal Drains: None Specimens: Gallbladder to pathology Complications: None Sponge and needle counts correct at completion Disposition to recovery stable condition  Indications: This is a 76 yof with symptomatic cholelithiasis that has been worsening. We discussed proceeding with lap chole.   Procedure: After informed consent was obtained the patient was taken the operatingroom. She was on antibiotics.SCDs were in place. She was placed under general anesthesia without complication. Her abdomen was prepped and draped in the standard sterile surgical fashion. A surgical timeout was then performed.  Iinfiltrated Marcaine below her umbilicus. I made a vertical incision. I grasped the fascia with a Kocherclamp. I incised the fascia and entered into the peritoneum bluntly. This was done without injury. I placed a 0 Vicryl pursestring suture and inserted a Hassan trocar. The abdomen was insufflatedto43mmHg pressure. I then inserted 3 further 5 mm trocars in the epigastrium and right side of the abdomen under direct vision without complication. I then retracted it cephalad and lateral. I was able to dissect in the triangle and obtain the critical view of safety. I clipped the artery 3 times and divided it leaving 2 clips in place. I then treated the cystic duct in a similar fashion.  The duct was viable and the clips traversed the duct. The gallbladder was then removed from the liver bed without difficulty. It was placed in a retrieval bag. I then was able to obtain hemostasis. I irrigated and this was clear.I did place a small piece of surgicel snow in the liver bed.I then desufflated the abdomen and removed the trocars. Once this had been completed I removed all the trocars and the  gallbladder and the retrieval bag were all removed as well. I then tied down my pursestring suture. I placed an additional 0 Vicrylin theumbilical incision. I then closed these with 4-0 Monocryl and glue.She tolerated this well was extubated and transferred to recovery stable.

## 2018-10-13 ENCOUNTER — Encounter (HOSPITAL_COMMUNITY): Payer: Self-pay | Admitting: General Surgery

## 2019-01-11 DIAGNOSIS — M810 Age-related osteoporosis without current pathological fracture: Secondary | ICD-10-CM | POA: Insufficient documentation

## 2019-08-03 ENCOUNTER — Ambulatory Visit: Payer: Managed Care, Other (non HMO) | Attending: Internal Medicine

## 2019-08-03 DIAGNOSIS — Z23 Encounter for immunization: Secondary | ICD-10-CM

## 2019-08-03 NOTE — Progress Notes (Signed)
   Covid-19 Vaccination Clinic  Name:  Jashonna Guichard    MRN: [id number] DOB: 1960-09-30  08/03/2019  Ms. Berroa was observed post Covid-19 immunization for 15 minutes without incident. She was provided with Vaccine Information Sheet and instruction to access the V-Safe system.   Ms. Bullis was instructed to call 911 with any severe reactions post vaccine: Marland Kitchen Difficulty breathing  . Swelling of face and throat  . A fast heartbeat  . A bad rash all over body  . Dizziness and weakness   Immunizations Administered    Name Date Dose VIS Date Route   Pfizer COVID-19 Vaccine 08/03/2019  1:51 PM 0.3 mL 05/03/2019 Intramuscular   Manufacturer: [place]   Lot: [id number]   [place]: [id number]

## 2019-08-28 ENCOUNTER — Ambulatory Visit: Payer: Managed Care, Other (non HMO) | Attending: Internal Medicine

## 2019-08-28 DIAGNOSIS — Z23 Encounter for immunization: Secondary | ICD-10-CM

## 2019-08-28 NOTE — Progress Notes (Signed)
   Covid-19 Vaccination Clinic  Name:  Casey Bauer    MRN: [id number] DOB: May 29, 1960  08/28/2019  [name] was observed post Covid-19 immunization for 15 minutes without incident. She was provided with Vaccine Information Sheet and instruction to access the V-Safe system.   [name] was instructed to call 911 with any severe reactions post vaccine: Marland Kitchen Difficulty breathing  . Swelling of face and throat  . A fast heartbeat  . A bad rash all over body  . Dizziness and weakness   Immunizations Administered    Name Date Dose VIS Date Route   Pfizer COVID-19 Vaccine 08/28/2019 10:56 AM 0.3 mL 05/03/2019 Intramuscular   Manufacturer: [place]   Lot: [id number]   [place]: [id number]

## 2020-02-06 ENCOUNTER — Other Ambulatory Visit: Payer: Self-pay

## 2020-02-06 ENCOUNTER — Other Ambulatory Visit (HOSPITAL_COMMUNITY): Payer: Self-pay | Admitting: Obstetrics & Gynecology

## 2020-02-06 ENCOUNTER — Other Ambulatory Visit: Payer: Self-pay | Admitting: Obstetrics & Gynecology

## 2020-02-06 DIAGNOSIS — E041 Nontoxic single thyroid nodule: Secondary | ICD-10-CM

## 2020-02-07 ENCOUNTER — Other Ambulatory Visit: Payer: Self-pay

## 2020-02-07 ENCOUNTER — Inpatient Hospital Stay: Payer: 59 | Attending: Gynecologic Oncology | Admitting: Gynecologic Oncology

## 2020-02-07 ENCOUNTER — Other Ambulatory Visit: Payer: Self-pay | Admitting: Gynecologic Oncology

## 2020-02-07 VITALS — BP 132/83 | HR 74 | Temp 97.8°F | Resp 18 | Ht 61.0 in | Wt 106.7 lb

## 2020-02-07 DIAGNOSIS — N83202 Unspecified ovarian cyst, left side: Secondary | ICD-10-CM | POA: Diagnosis not present

## 2020-02-07 MED ORDER — IBUPROFEN 800 MG PO TABS: 800.0000 mg | ORAL_TABLET | Freq: Three times a day (TID) | ORAL | 0 refills | Status: DC | PRN

## 2020-02-07 MED ORDER — OXYCODONE HCL 5 MG PO TABS: 5.0000 mg | ORAL_TABLET | ORAL | 0 refills | Status: DC | PRN

## 2020-02-07 MED ORDER — SENNOSIDES-DOCUSATE SODIUM 8.6-50 MG PO TABS: 2.0000 | ORAL_TABLET | Freq: Every day | ORAL | 0 refills | Status: DC

## 2020-02-07 NOTE — H&P (View-Only) (Signed)
Consult Note: Gyn-Onc  Consult was requested by Dr. Lynnette Caffey for the evaluation of Casey Bauer 59 y.o. female  CC:  Chief Complaint  Patient presents with  . left ovarian cyst    Assessment/Plan:  Casey Bauer  is a 59 y.o.  year old with a left ovarian complex cystic mass in the setting of a normal Ca1 25 tumor marker.  I am recommending robotic assisted unilateral salpingo-oophorectomy, frozen section, possible staging with hysterectomy and lymphadenectomy and completion right salpingo-oophorectomy if malignancy is identified.  I offered the patient hysterectomy and RSO in the situation of the benign finding, discussing the potential benefits of no for the development of cyst on the right ovary, and simplified hormone replacement therapy regimen should she desire these.  I discussed the disadvantages of elective hysterectomy BSO would be potentiation of osteoporosis and slightly increased risk and recovery associated with such surgery.  She elected for USO with only completion hysterectomy and BSO if malignancy is found.  I discussed surgical risks including  bleeding, infection, damage to internal organs (such as bladder,ureters, bowels), blood clot, reoperation and rehospitalization.  I discussed potential recovery in the hospital for an outpatient procedure and postoperative limitations.  Surgery is scheduled for next week.   HPI: Casey Bauer is a 59 year old P1 who was seen in consultation at the request of Dr. Lynnette Caffey for evaluation of a complex left ovarian cyst.  She was seen by Dr. Lynnette Caffey on February 04, 2020 for routine gynecologic evaluation.  During that time an endocervical polyp was seen on speculum exam which was grasped and removed and sent for pathology.  At the time her initial consultation with me that pathology report was not available.  At the time of that examination her uterus was palpably enlarged and therefore the ordered a transvaginal ultrasound to be  performed in the office.  This ultrasound was performed on the same day, February 04, 2020, and revealed a normal-sized uterus measuring 5.3 x 1.7 x 3.7 cm with an endometrial thickness of 1.1 mm.  The low right ovary was not seen.  The left ovary contained multiple cysts with the largest measuring 12.9 x 14 cm and additionally 3 and 4 cm cysts in aggregate.  There was no blood flow seen within the cyst.  A Ca1 25 was drawn the same day which was normal at 27.3.  The patient reported no vaginal bleeding, postcoital bleeding, pelvic pain, urinary frequency, early satiety, or abdominal bloating.  Her medical history is most significant for thyroid cysts, osteoporosis.  Her surgical history is most significant for laparoscopic cholecystectomy.  Her gynecologic history is remarkable for SVD x 1.  Her family cancer history is lung cancer both parents (smokers).  She works as a Radiographer, therapeutic. She lives with her husband, daughter and mother (for whom she cares while she is receiving chemotherapy).   Current Meds:  Outpatient Encounter Medications as of 02/07/2020  Medication Sig  . alendronate (FOSAMAX) 10 MG tablet Take 10 mg by mouth daily.  Marland Kitchen conjugated estrogens (PREMARIN) vaginal cream Premarin 0.625 mg/gram vaginal cream  insert 0.5 applicator full vaginally 2-3 x weekly  . ibuprofen (ADVIL) 200 MG tablet Take 400 mg by mouth every 8 (eight) hours as needed (for pain.).  . [DISCONTINUED] oxyCODONE (OXY IR/ROXICODONE) 5 MG immediate release tablet Take 1 tablet (5 mg total) by mouth every 6 (six) hours as needed for moderate pain, severe pain or breakthrough pain.   No facility-administered encounter medications on file as  of 02/07/2020.    Allergy: No Known Allergies  Social Hx:   Social History   Socioeconomic History  . Marital status: Married    Spouse name: Not on file  . Number of children: 1  . Years of education: Not on file  . Highest education level: Not on file   Occupational History  . Occupation: home maker  Tobacco Use  . Smoking status: Never Smoker  . Smokeless tobacco: Never Used  Vaping Use  . Vaping Use: Never used  Substance and Sexual Activity  . Alcohol use: Yes    Comment: occasional wine  . Drug use: Never  . Sexual activity: Not on file  Other Topics Concern  . Not on file  Social History Narrative  . Not on file   Social Determinants of Health   Financial Resource Strain:   . Difficulty of Paying Living Expenses: Not on file  Food Insecurity:   . Worried About Charity fundraiser in the Last Year: Not on file  . Ran Out of Food in the Last Year: Not on file  Transportation Needs:   . Lack of Transportation (Medical): Not on file  . Lack of Transportation (Non-Medical): Not on file  Physical Activity:   . Days of Exercise per Week: Not on file  . Minutes of Exercise per Session: Not on file  Stress:   . Feeling of Stress : Not on file  Social Connections:   . Frequency of Communication with Friends and Family: Not on file  . Frequency of Social Gatherings with Friends and Family: Not on file  . Attends Religious Services: Not on file  . Active Member of Clubs or Organizations: Not on file  . Attends Archivist Meetings: Not on file  . Marital Status: Not on file  Intimate Partner Violence:   . Fear of Current or Ex-Partner: Not on file  . Emotionally Abused: Not on file  . Physically Abused: Not on file  . Sexually Abused: Not on file    Past Surgical Hx:  Past Surgical History:  Procedure Laterality Date  . ARM SURGERY Right    fracture  . CHOLECYSTECTOMY N/A 10/12/2018   Procedure: LAPAROSCOPIC CHOLECYSTECTOMY;  Surgeon: Rolm Bookbinder, MD;  Location: [facility];  Service: General;  Laterality: N/A;  TAP BLOCK  . COLONOSCOPY    . UPPER GI ENDOSCOPY    . WISDOM TOOTH EXTRACTION      Past Medical Hx:  Past Medical History:  Diagnosis Date  . Complication of anesthesia    slow to wake up  .  History of kidney stones   . IBS (irritable bowel syndrome)   . Osteopenia   . Osteoporosis   . STD (female)     Past Gynecological History:  See HPI No LMP recorded. Patient is postmenopausal.  Family Hx:  Family History  Problem Relation Age of Onset  . Gallbladder disease Mother   . Cancer Mother        lung  . COPD Father   . Asthma Father   . Heart failure Father   . Gallbladder disease Sister   . Bone cancer Paternal Grandmother   . Gallbladder disease Sister     Review of Systems:  Constitutional  Feels well,    ENT Normal appearing ears and nares bilaterally Skin/Breast  No rash, sores, jaundice, itching, dryness Cardiovascular  No chest pain, shortness of breath, or edema  Pulmonary  No cough or wheeze.  Gastro Intestinal  No nausea, vomitting, or diarrhoea. No bright red blood per rectum, no abdominal pain, change in bowel movement, or constipation.  Genito Urinary  No frequency, urgency, dysuria, no bleeding Musculo Skeletal  No myalgia, arthralgia, joint swelling or pain  Neurologic  No weakness, numbness, change in gait,  Psychology  No depression, anxiety, insomnia.   Vitals:  Blood pressure 132/83, pulse 74, temperature 97.8 F (36.6 C), temperature source Tympanic, resp. rate 18, height 5\' 1"  (1.549 m), weight 106 lb 11.2 oz (48.4 kg), SpO2 100 %.  Physical Exam: WD in NAD Neck  Supple NROM, without any enlargements.  Lymph Node Survey No cervical supraclavicular or inguinal adenopathy Cardiovascular  Pulse normal rate, regularity and rhythm. S1 and S2 normal.  Lungs  Clear to auscultation bilateraly, without wheezes/crackles/rhonchi. Good air movement.  Skin  No rash/lesions/breakdown  Psychiatry  Alert and oriented to person, place, and time  Abdomen  Normoactive bowel sounds, abdomen soft, non-tender and thin without evidence of hernia. Palpable lower abdominal fullness. Back No CVA tenderness Genito Urinary  Vulva/vagina: Normal  external female genitalia.  No lesions. No discharge or bleeding.  Bladder/urethra:  No lesions or masses, well supported bladder  Vagina: normal, no lesions  Cervix: Normal appearing, no lesions.Blood streaked mucous from cervix.  Uterus:  Small, mobile, no parametrial involvement or nodularity.  Adnexa: Fullness in pelvis and cul de sac from smooth cystic masses. Rectal  Good tone, no masses no cul de sac nodularity.  Extremities  No bilateral cyanosis, clubbing or edema.  60 minutes of total time was spent for this patient encounter, including preparation, face-to-face counseling with the patient and coordination of care, review of imaging (results and images), communication with the referring provider and documentation of the encounter.   Thereasa Solo, MD  02/07/2020, 11:26 AM

## 2020-02-07 NOTE — Patient Instructions (Addendum)
DUE TO COVID-19 ONLY ONE VISITOR IS ALLOWED TO COME WITH YOU AND STAY IN THE WAITING ROOM ONLY DURING PRE OP AND PROCEDURE DAY OF SURGERY. THE 1 VISITOR  MAY VISIT WITH YOU AFTER SURGERY IN YOUR PRIVATE ROOM DURING VISITING HOURS ONLY!  YOU NEED TO HAVE A COVID 19 TEST ON 02-07-20.  PLEASE CONTINUE THE QUARANTINE INSTRUCTIONS AS OUTLINED IN YOUR HANDOUT.                Casey Bauer  02/07/2020   Your procedure is scheduled on: 02-11-20   Report to [facility] Main  Entrance    Report to Admitting at 5:30 AM     Call this number if you have problems the morning of surgery [phone]    Remember: After Midnight. You may have clear liquids from midnight until 4:30 AM. After 4:30 AM, nothing until after surgery.    CLEAR LIQUID DIET   Foods Allowed                                                                       Coffee and tea, regular and decaf                              Plain Jell-O any favor except red or purple                                            Fruit ices (not with fruit pulp)                                      Iced Popsicles                                     Carbonated beverages, regular and diet                                    Cranberry, grape and apple juices Sports drinks like Gatorade Lightly seasoned clear broth or consume(fat free) Sugar, honey syrup   _____________________________________________________________________         Take these medicines the morning of surgery with A SIP OF WATER: None  BRUSH YOUR TEETH MORNING OF SURGERY AND RINSE YOUR MOUTH OUT, NO CHEWING GUM CANDY OR MINTS.                                 You may not have any metal on your body including hair pins and              piercings     Do not wear jewelry, make-up, lotions, powders or perfumes, deodorant              Do not wear nail polish on your fingernails.  Do not shave  48  hours prior to surgery.     Do not bring valuables to the hospital.  [place].  Contacts, dentures or bridgework may not be worn into surgery.      Patients discharged the day of surgery will not be allowed to drive home. IF YOU ARE HAVING SURGERY AND GOING HOME THE SAME DAY, YOU MUST HAVE AN ADULT TO DRIVE YOU HOME AND  BE WITH YOU FOR 24 HOURS. YOU MAY GO HOME BY TAXI OR UBER OR OTHERWISE, BUT AN ADULT MUST ACCOMPANY YOU HOME AND STAY WITH YOU FOR 24 HOURS.  Name and phone number of your driver: Bradd Canary [phone]                Please read over the following fact sheets you were given: _____________________________________________________________________  [facility] - Preparing for Surgery Before surgery, you can play an important role.  Because skin is not sterile, your skin needs to be as free of germs as possible.  You can reduce the number of germs on your skin by washing with CHG (chlorahexidine gluconate) soap before surgery.  CHG is an antiseptic cleaner which kills germs and bonds with the skin to continue killing germs even after washing. Please DO NOT use if you have an allergy to CHG or antibacterial soaps.  If your skin becomes reddened/irritated stop using the CHG and inform your nurse when you arrive at Short Stay. Do not shave (including legs and underarms) for at least 48 hours prior to the first CHG shower.  You may shave your face/neck. Please follow these instructions carefully:  1.  Shower with CHG Soap the night before surgery and the  morning of Surgery.  2.  If you choose to wash your hair, wash your hair first as usual with your  normal  shampoo.  3.  After you shampoo, rinse your hair and body thoroughly to remove the  shampoo.                           4.  Use CHG as you would any other liquid soap.  You can apply chg directly  to the skin and wash                       Gently with a scrungie or clean washcloth.  5.  Apply the CHG Soap to your body ONLY FROM THE NECK DOWN.   Do  not use on face/ open                           Wound or open sores. Avoid contact with eyes, ears mouth and genitals (private parts).                       Wash face,  Genitals (private parts) with your normal soap.             6.  Wash thoroughly, paying special attention to the area where your surgery  will be performed.  7.  Thoroughly rinse your body with warm water from the neck down.  8.  DO NOT shower/wash with your normal soap after using and rinsing off  the CHG Soap.                9.  Pat yourself dry with a clean towel.            10.  Wear clean pajamas.            11.  Place clean sheets on your bed the night of your first shower and do not  sleep with pets. Day of Surgery : Do not apply any lotions/deodorants the morning of surgery.  Please wear clean clothes to the hospital/surgery center.  FAILURE TO FOLLOW THESE INSTRUCTIONS MAY RESULT IN THE CANCELLATION OF YOUR SURGERY PATIENT SIGNATURE_________________________________  NURSE SIGNATURE__________________________________  ________________________________________________________________________

## 2020-02-07 NOTE — Patient Instructions (Signed)
Preparing for your Surgery  Plan for surgery on February 11, 2020 with Dr. Everitt Amber at [facility] will be scheduled for a robotic assisted laparoscopic unilateral salpingo-oophorectomy, possible staging.   Pre-operative Testing -You will receive a phone call from presurgical testing at [facility] to arrange for a pre-operative appointment over the phone, lab appointment, and COVID test. The COVID test normally happens 3 days prior to the surgery and they ask that you self quarantine after the test up until surgery to decrease chance of exposure.  -Bring your insurance card, copy of an advanced directive if applicable, medication list  -At that visit, you will be asked to sign a consent for a possible blood transfusion in case a transfusion becomes necessary during surgery.  The need for a blood transfusion is rare but having consent is a necessary part of your care.     -You should not be taking blood thinners or aspirin at least ten days prior to surgery unless instructed by your surgeon.  -Do not take supplements such as fish oil (omega 3), red yeast rice, turmeric before your surgery.   Day Before Surgery at [facility] [place] will be asked to take in a light diet the day before surgery. You will be advised you can have clear liquids up until 3 hours before your surgery.    Eat a light diet the day before surgery.  Examples including soups, broths, toast, yogurt, mashed potatoes.  AVOID GAS PRODUCING FOODS. Things to avoid include carbonated beverages (fizzy beverages), raw fruits and raw vegetables, or beans.   If your bowels are filled with gas, your surgeon will have difficulty visualizing your pelvic organs which increases your surgical risks.  Your role in recovery Your role is to become active as soon as directed by your doctor, while still giving yourself time to heal.  Rest when you feel tired. You will be asked to do the following in order to speed your  recovery:  - Cough and breathe deeply. This helps to clear and expand your lungs and can prevent pneumonia after surgery.  - [place]. Do mild physical activity. Walking or moving your legs help your circulation and body functions return to normal. Do not try to get up or walk alone the first time after surgery.   -If you develop swelling on one leg or the other, pain in the back of your leg, redness/warmth in one of your legs, please call the office or go to the Emergency Room to have a doppler to rule out a blood clot. For shortness of breath, chest pain-seek care in the Emergency Room as soon as possible. - Actively manage your pain. Managing your pain lets you move in comfort. We will ask you to rate your pain on a scale of zero to 10. It is your responsibility to tell your doctor or nurse where and how much you hurt so your pain can be treated.  Special Considerations -If you are diabetic, you may be placed on insulin after surgery to have closer control over your blood sugars to promote healing and recovery.  This does not mean that you will be discharged on insulin.  If applicable, your oral antidiabetics will be resumed when you are tolerating a solid diet.  -Your final pathology results from surgery should be available around one week after surgery and the results will be relayed to you when available.  -Dr. Lahoma Crocker is the surgeon that assists  your GYN Oncologist with surgery.  If you end up staying the night, the next day after your surgery you will either see Dr. Denman George, Dr. Berline Lopes, or Dr. Lahoma Crocker.  -FMLA forms can be faxed to [phone] and please allow 5-7 business days for completion.  Pain Management After Surgery -You have been prescribed your pain medication and bowel regimen medications before surgery so that you can have these available when you are discharged from the hospital. The pain medication is for use ONLY AFTER surgery and a new  prescription will not be given.   -Make sure that you have Tylenol and Ibuprofen at home to use on a regular basis after surgery for pain control. We recommend alternating the medications every hour to six hours since they work differently and are processed in the body differently for pain relief.  -Review the attached handout on narcotic use and their risks and side effects.   Bowel Regimen -You have been prescribed Sennakot-S to take nightly to prevent constipation especially if you are taking the narcotic pain medication intermittently.  It is important to prevent constipation and drink adequate amounts of liquids. You can stop taking this medication when you are not taking pain medication and you are back on your normal bowel routine.  Risks of Surgery Risks of surgery are low but include bleeding, infection, damage to surrounding structures, re-operation, blood clots, and very rarely death.   Blood Transfusion Information (For the consent to be signed before surgery)  We will be checking your blood type before surgery so in case of emergencies, we will know what type of blood you would need.                                            WHAT IS A BLOOD TRANSFUSION?  A transfusion is the replacement of blood or some of its parts. Blood is made up of multiple cells which provide different functions.  Red blood cells carry oxygen and are used for blood loss replacement.  White blood cells fight against infection.  Platelets control bleeding.  Plasma helps clot blood.  Other blood products are available for specialized needs, such as hemophilia or other clotting disorders. BEFORE THE TRANSFUSION  Who gives blood for transfusions?   You may be able to donate blood to be used at a later date on yourself (autologous donation).  Relatives can be asked to donate blood. This is generally not any safer than if you have received blood from a stranger. The same precautions are taken to ensure  safety when a relative's blood is donated.  Healthy volunteers who are fully evaluated to make sure their blood is safe. This is blood bank blood. Transfusion therapy is the safest it has ever been in the practice of medicine. Before blood is taken from a donor, a complete history is taken to make sure that person has no history of diseases nor engages in risky social behavior (examples are intravenous drug use or sexual activity with multiple partners). The donor's travel history is screened to minimize risk of transmitting infections, such as malaria. The donated blood is tested for signs of infectious diseases, such as HIV and hepatitis. The blood is then tested to be sure it is compatible with you in order to minimize the chance of a transfusion reaction. If you or a relative donates blood, this is often done  in anticipation of surgery and is not appropriate for emergency situations. It takes many days to process the donated blood. RISKS AND COMPLICATIONS Although transfusion therapy is very safe and saves many lives, the main dangers of transfusion include:   Getting an infectious disease.  Developing a transfusion reaction. This is an allergic reaction to something in the blood you were given. Every precaution is taken to prevent this. The decision to have a blood transfusion has been considered carefully by your caregiver before blood is given. Blood is not given unless the benefits outweigh the risks.  AFTER SURGERY INSTRUCTIONS  Return to work: 4-6 weeks if applicable  Activity: 1. Be up and out of the bed during the day.  Take a nap if needed.  You may walk up steps but be careful and use the hand rail.  Stair climbing will tire you more than you think, you may need to stop part way and rest.   2. No lifting or straining for 6 weeks over 10 pounds. No pushing, pulling, straining for 6 weeks.  3. No driving for 1 week(s).  Do not drive if you are taking narcotic pain medicine and make  sure that your reaction time has returned.   4. You can shower as soon as the next day after surgery. Shower daily.  Use your regular soap and water (not directly on the incision) and pat your incision(s) dry afterwards; don't rub.  No tub baths or submerging your body in water until cleared by your surgeon. If you have the soap that was given to you by pre-surgical testing that was used before surgery, you do not need to use it afterwards because this can irritate your incisions.   5. No sexual activity and nothing in the vagina for 4 weeks.  6. You may experience a small amount of clear drainage from your incisions, which is normal.  If the drainage persists, increases, or changes color please call the office.  7. Do not use creams, lotions, or ointments such as neosporin on your incisions after surgery until advised by your surgeon because they can cause removal of the dermabond glue on your incisions.    8. You may experience vaginal spotting after surgery.  The spotting is normal but if you experience heavy bleeding, call our office.  9. Take Tylenol or ibuprofen first for pain and only use narcotic pain medication for severe pain not relieved by the Tylenol or Ibuprofen.  Monitor your Tylenol intake to a max of 4,000 mg in a 24 hour period. You can alternate these medications after surgery.  Diet: 1. Low sodium Heart Healthy Diet is recommended but you are cleared to resume your normal (before surgery) diet after your procedure.  2. It is safe to use a laxative, such as Miralax or Colace, if you have difficulty moving your bowels. You have been prescribed Sennakot at bedtime every evening to keep bowel movements regular and to prevent constipation.    Wound Care: 1. Keep clean and dry.  Shower daily.  Reasons to call the Doctor:  Fever - Oral temperature greater than 100.4 degrees Fahrenheit  Foul-smelling vaginal discharge  Difficulty urinating  Nausea and vomiting  Increased  pain at the site of the incision that is unrelieved with pain medicine.  Difficulty breathing with or without chest pain  New calf pain especially if only on one side  Sudden, continuing increased vaginal bleeding with or without clots.   Contacts: For questions or concerns you should  contact:  Dr. Everitt Amber at [phone]  Joylene John, NP at [phone]  After Hours: call [phone] and have the GYN Oncologist paged/contacted

## 2020-02-07 NOTE — Progress Notes (Signed)
Consult Note: Gyn-Onc  Consult was requested by Dr. Lynnette Caffey for the evaluation of Casey Bauer 59 y.o. female  CC:  Chief Complaint  Patient presents with  . left ovarian cyst    Assessment/Plan:  Casey Bauer  is a 59 y.o.  year old with a left ovarian complex cystic mass in the setting of a normal Ca1 25 tumor marker.  I am recommending robotic assisted unilateral salpingo-oophorectomy, frozen section, possible staging with hysterectomy and lymphadenectomy and completion right salpingo-oophorectomy if malignancy is identified.  I offered the patient hysterectomy and RSO in the situation of the benign finding, discussing the potential benefits of no for the development of cyst on the right ovary, and simplified hormone replacement therapy regimen should she desire these.  I discussed the disadvantages of elective hysterectomy BSO would be potentiation of osteoporosis and slightly increased risk and recovery associated with such surgery.  She elected for USO with only completion hysterectomy and BSO if malignancy is found.  I discussed surgical risks including  bleeding, infection, damage to internal organs (such as bladder,ureters, bowels), blood clot, reoperation and rehospitalization.  I discussed potential recovery in the hospital for an outpatient procedure and postoperative limitations.  Surgery is scheduled for next week.   HPI: Casey Bauer is a 59 year old P1 who was seen in consultation at the request of Dr. Lynnette Caffey for evaluation of a complex left ovarian cyst.  She was seen by Dr. Lynnette Caffey on February 04, 2020 for routine gynecologic evaluation.  During that time an endocervical polyp was seen on speculum exam which was grasped and removed and sent for pathology.  At the time her initial consultation with me that pathology report was not available.  At the time of that examination her uterus was palpably enlarged and therefore the ordered a transvaginal ultrasound to be  performed in the office.  This ultrasound was performed on the same day, February 04, 2020, and revealed a normal-sized uterus measuring 5.3 x 1.7 x 3.7 cm with an endometrial thickness of 1.1 mm.  The low right ovary was not seen.  The left ovary contained multiple cysts with the largest measuring 12.9 x 14 cm and additionally 3 and 4 cm cysts in aggregate.  There was no blood flow seen within the cyst.  A Ca1 25 was drawn the same day which was normal at 27.3.  The patient reported no vaginal bleeding, postcoital bleeding, pelvic pain, urinary frequency, early satiety, or abdominal bloating.  Her medical history is most significant for thyroid cysts, osteoporosis.  Her surgical history is most significant for laparoscopic cholecystectomy.  Her gynecologic history is remarkable for SVD x 1.  Her family cancer history is lung cancer both parents (smokers).  She works as a Radiographer, therapeutic. She lives with her husband, daughter and mother (for whom she cares while she is receiving chemotherapy).   Current Meds:  Outpatient Encounter Medications as of 02/07/2020  Medication Sig  . alendronate (FOSAMAX) 10 MG tablet Take 10 mg by mouth daily.  Marland Kitchen conjugated estrogens (PREMARIN) vaginal cream Premarin 0.625 mg/gram vaginal cream  insert 0.5 applicator full vaginally 2-3 x weekly  . ibuprofen (ADVIL) 200 MG tablet Take 400 mg by mouth every 8 (eight) hours as needed (for pain.).  . [DISCONTINUED] oxyCODONE (OXY IR/ROXICODONE) 5 MG immediate release tablet Take 1 tablet (5 mg total) by mouth every 6 (six) hours as needed for moderate pain, severe pain or breakthrough pain.   No facility-administered encounter medications on file as  of 02/07/2020.    Allergy: No Known Allergies  Social Hx:   Social History   Socioeconomic History  . Marital status: Married    Spouse name: Not on file  . Number of children: 1  . Years of education: Not on file  . Highest education level: Not on file   Occupational History  . Occupation: home maker  Tobacco Use  . Smoking status: Never Smoker  . Smokeless tobacco: Never Used  Vaping Use  . Vaping Use: Never used  Substance and Sexual Activity  . Alcohol use: Yes    Comment: occasional wine  . Drug use: Never  . Sexual activity: Not on file  Other Topics Concern  . Not on file  Social History Narrative  . Not on file   Social Determinants of Health   Financial Resource Strain:   . Difficulty of Paying Living Expenses: Not on file  Food Insecurity:   . Worried About Charity fundraiser in the Last Year: Not on file  . Ran Out of Food in the Last Year: Not on file  Transportation Needs:   . Lack of Transportation (Medical): Not on file  . Lack of Transportation (Non-Medical): Not on file  Physical Activity:   . Days of Exercise per Week: Not on file  . Minutes of Exercise per Session: Not on file  Stress:   . Feeling of Stress : Not on file  Social Connections:   . Frequency of Communication with Friends and Family: Not on file  . Frequency of Social Gatherings with Friends and Family: Not on file  . Attends Religious Services: Not on file  . Active Member of Clubs or Organizations: Not on file  . Attends Archivist Meetings: Not on file  . Marital Status: Not on file  Intimate Partner Violence:   . Fear of Current or Ex-Partner: Not on file  . Emotionally Abused: Not on file  . Physically Abused: Not on file  . Sexually Abused: Not on file    Past Surgical Hx:  Past Surgical History:  Procedure Laterality Date  . ARM SURGERY Right    fracture  . CHOLECYSTECTOMY N/A 10/12/2018   Procedure: LAPAROSCOPIC CHOLECYSTECTOMY;  Surgeon: Rolm Bookbinder, MD;  Location: [facility];  Service: General;  Laterality: N/A;  TAP BLOCK  . COLONOSCOPY    . UPPER GI ENDOSCOPY    . WISDOM TOOTH EXTRACTION      Past Medical Hx:  Past Medical History:  Diagnosis Date  . Complication of anesthesia    slow to wake up  .  History of kidney stones   . IBS (irritable bowel syndrome)   . Osteopenia   . Osteoporosis   . STD (female)     Past Gynecological History:  See HPI No LMP recorded. Patient is postmenopausal.  Family Hx:  Family History  Problem Relation Age of Onset  . Gallbladder disease Mother   . Cancer Mother        lung  . COPD Father   . Asthma Father   . Heart failure Father   . Gallbladder disease Sister   . Bone cancer Paternal Grandmother   . Gallbladder disease Sister     Review of Systems:  Constitutional  Feels well,    ENT Normal appearing ears and nares bilaterally Skin/Breast  No rash, sores, jaundice, itching, dryness Cardiovascular  No chest pain, shortness of breath, or edema  Pulmonary  No cough or wheeze.  Gastro Intestinal  No nausea, vomitting, or diarrhoea. No bright red blood per rectum, no abdominal pain, change in bowel movement, or constipation.  Genito Urinary  No frequency, urgency, dysuria, no bleeding Musculo Skeletal  No myalgia, arthralgia, joint swelling or pain  Neurologic  No weakness, numbness, change in gait,  Psychology  No depression, anxiety, insomnia.   Vitals:  Blood pressure 132/83, pulse 74, temperature 97.8 F (36.6 C), temperature source Tympanic, resp. rate 18, height 5\' 1"  (1.549 m), weight 106 lb 11.2 oz (48.4 kg), SpO2 100 %.  Physical Exam: WD in NAD Neck  Supple NROM, without any enlargements.  Lymph Node Survey No cervical supraclavicular or inguinal adenopathy Cardiovascular  Pulse normal rate, regularity and rhythm. S1 and S2 normal.  Lungs  Clear to auscultation bilateraly, without wheezes/crackles/rhonchi. Good air movement.  Skin  No rash/lesions/breakdown  Psychiatry  Alert and oriented to person, place, and time  Abdomen  Normoactive bowel sounds, abdomen soft, non-tender and thin without evidence of hernia. Palpable lower abdominal fullness. Back No CVA tenderness Genito Urinary  Vulva/vagina: Normal  external female genitalia.  No lesions. No discharge or bleeding.  Bladder/urethra:  No lesions or masses, well supported bladder  Vagina: normal, no lesions  Cervix: Normal appearing, no lesions.Blood streaked mucous from cervix.  Uterus:  Small, mobile, no parametrial involvement or nodularity.  Adnexa: Fullness in pelvis and cul de sac from smooth cystic masses. Rectal  Good tone, no masses no cul de sac nodularity.  Extremities  No bilateral cyanosis, clubbing or edema.  60 minutes of total time was spent for this patient encounter, including preparation, face-to-face counseling with the patient and coordination of care, review of imaging (results and images), communication with the referring provider and documentation of the encounter.   Thereasa Solo, MD  02/07/2020, 11:26 AM

## 2020-02-07 NOTE — Progress Notes (Addendum)
COVID Vaccine Completed: Date COVID Vaccine completed: COVID vaccine manufacturer: [place]   PCP - Linda Hedges  DO Cardiologist -   Chest x-ray -  EKG -  Stress Test -  ECHO -  Cardiac Cath -  Pacemaker/ICD device last checked:  Sleep Study -  CPAP -   Fasting Blood Sugar -  Checks Blood Sugar _____ times a day  Blood Thinner Instructions: Aspirin Instructions: Last Dose:  Anesthesia review:   Patient denies shortness of breath, fever, cough and chest pain at PAT appointment   Patient verbalized understanding of instructions that were given to them at the PAT appointment. Patient was also instructed that they will need to review over the PAT instructions again at home before surgery.

## 2020-02-08 ENCOUNTER — Other Ambulatory Visit (HOSPITAL_COMMUNITY): Payer: Managed Care, Other (non HMO)

## 2020-02-08 ENCOUNTER — Other Ambulatory Visit (HOSPITAL_COMMUNITY)
Admission: RE | Admit: 2020-02-08 | Discharge: 2020-02-08 | Disposition: A | Discharge status: Home / Self Care | Payer: 59 | Source: Ambulatory Visit | Attending: Gynecologic Oncology | Admitting: Gynecologic Oncology

## 2020-02-08 DIAGNOSIS — Z20822 Contact with and (suspected) exposure to covid-19: Secondary | ICD-10-CM | POA: Insufficient documentation

## 2020-02-08 DIAGNOSIS — Z01812 Encounter for preprocedural laboratory examination: Secondary | ICD-10-CM | POA: Diagnosis not present

## 2020-02-08 LAB — SARS CORONAVIRUS 2 (TAT 6-24 HRS): SARS Coronavirus 2: NEGATIVE

## 2020-02-10 ENCOUNTER — Encounter (HOSPITAL_COMMUNITY)
Admission: RE | Admit: 2020-02-10 | Discharge: 2020-02-10 | Disposition: A | Discharge status: Home / Self Care | Payer: 59 | Source: Ambulatory Visit | Attending: Gynecologic Oncology | Admitting: Gynecologic Oncology

## 2020-02-10 ENCOUNTER — Telehealth: Payer: Self-pay

## 2020-02-10 ENCOUNTER — Other Ambulatory Visit: Payer: Self-pay

## 2020-02-10 ENCOUNTER — Encounter (HOSPITAL_COMMUNITY): Payer: Self-pay

## 2020-02-10 DIAGNOSIS — Z7983 Long term (current) use of bisphosphonates: Secondary | ICD-10-CM | POA: Diagnosis not present

## 2020-02-10 DIAGNOSIS — Z01812 Encounter for preprocedural laboratory examination: Secondary | ICD-10-CM | POA: Diagnosis present

## 2020-02-10 DIAGNOSIS — L905 Scar conditions and fibrosis of skin: Secondary | ICD-10-CM | POA: Diagnosis not present

## 2020-02-10 DIAGNOSIS — Z79890 Hormone replacement therapy (postmenopausal): Secondary | ICD-10-CM | POA: Diagnosis not present

## 2020-02-10 DIAGNOSIS — D270 Benign neoplasm of right ovary: Secondary | ICD-10-CM | POA: Diagnosis not present

## 2020-02-10 DIAGNOSIS — N83201 Unspecified ovarian cyst, right side: Secondary | ICD-10-CM | POA: Diagnosis present

## 2020-02-10 LAB — COMPREHENSIVE METABOLIC PANEL
ALT: 19 U/L (ref 0–44)
AST: 20 U/L (ref 15–41)
Albumin: 4.3 g/dL (ref 3.5–5.0)
Alkaline Phosphatase: 47 U/L (ref 38–126)
Anion gap: 8 (ref 5–15)
BUN: 18 mg/dL (ref 6–20)
CO2: 25 mmol/L (ref 22–32)
Calcium: 8.7 mg/dL — ABNORMAL LOW (ref 8.9–10.3)
Chloride: 106 mmol/L (ref 98–111)
Creatinine, Ser: 0.6 mg/dL (ref 0.44–1.00)
GFR calc Af Amer: >60 mL/min (ref >60)
GFR calc non Af Amer: >60 mL/min (ref >60)
Glucose, Bld: 96 mg/dL (ref 70–99)
Potassium: 3.9 mmol/L (ref 3.5–5.1)
Sodium: 139 mmol/L (ref 135–145)
Total Bilirubin: 0.5 mg/dL (ref 0.3–1.2)
Total Protein: 6.7 g/dL (ref 6.5–8.1)

## 2020-02-10 LAB — URINALYSIS, ROUTINE W REFLEX MICROSCOPIC
Bilirubin Urine: NEGATIVE
Glucose, UA: NEGATIVE mg/dL
Ketones, ur: NEGATIVE mg/dL
Nitrite: NEGATIVE
Protein, ur: NEGATIVE mg/dL
Specific Gravity, Urine: 1.018 (ref 1.005–1.030)
pH: 5 (ref 5.0–8.0)

## 2020-02-10 LAB — CBC
HCT: 42.1 % (ref 36.0–46.0)
Hemoglobin: 14.1 g/dL (ref 12.0–15.0)
MCH: 31.5 pg (ref 26.0–34.0)
MCHC: 33.5 g/dL (ref 30.0–36.0)
MCV: 94 fL (ref 80.0–100.0)
Platelets: 269 10*3/uL (ref 150–400)
RBC: 4.48 MIL/uL (ref 3.87–5.11)
RDW: 11.7 % (ref 11.5–15.5)
WBC: 6.5 10*3/uL (ref 4.0–10.5)
nRBC: 0 % (ref 0.0–0.2)

## 2020-02-10 NOTE — Telephone Encounter (Signed)
Casey Bauer states that she understands her written pre op instructions and has no questions or concerns at this time.

## 2020-02-11 ENCOUNTER — Ambulatory Visit (HOSPITAL_COMMUNITY): Payer: 59 | Admitting: Certified Registered Nurse Anesthetist

## 2020-02-11 ENCOUNTER — Ambulatory Visit (HOSPITAL_COMMUNITY): Payer: 59 | Admitting: Physician Assistant

## 2020-02-11 ENCOUNTER — Ambulatory Visit (HOSPITAL_COMMUNITY)
Admission: RE | Admit: 2020-02-11 | Discharge: 2020-02-11 | Disposition: A | Discharge status: Home / Self Care | Payer: 59 | Attending: Gynecologic Oncology | Admitting: Gynecologic Oncology

## 2020-02-11 ENCOUNTER — Encounter (HOSPITAL_COMMUNITY): Payer: Self-pay | Admitting: Gynecologic Oncology

## 2020-02-11 ENCOUNTER — Other Ambulatory Visit: Payer: Self-pay

## 2020-02-11 ENCOUNTER — Encounter (HOSPITAL_COMMUNITY)
Admission: RE | Disposition: A | Discharge status: Home / Self Care | Payer: Self-pay | Source: Home / Self Care | Attending: Gynecologic Oncology

## 2020-02-11 DIAGNOSIS — L905 Scar conditions and fibrosis of skin: Secondary | ICD-10-CM | POA: Insufficient documentation

## 2020-02-11 DIAGNOSIS — N83201 Unspecified ovarian cyst, right side: Secondary | ICD-10-CM

## 2020-02-11 DIAGNOSIS — D270 Benign neoplasm of right ovary: Secondary | ICD-10-CM

## 2020-02-11 DIAGNOSIS — N83202 Unspecified ovarian cyst, left side: Secondary | ICD-10-CM | POA: Diagnosis present

## 2020-02-11 DIAGNOSIS — Z79890 Hormone replacement therapy (postmenopausal): Secondary | ICD-10-CM | POA: Insufficient documentation

## 2020-02-11 DIAGNOSIS — Z7983 Long term (current) use of bisphosphonates: Secondary | ICD-10-CM | POA: Insufficient documentation

## 2020-02-11 HISTORY — PX: ROBOTIC ASSISTED SALPINGO OOPHERECTOMY: SHX6082

## 2020-02-11 LAB — URINE CULTURE: Culture: <10000 — AB

## 2020-02-11 LAB — TYPE AND SCREEN
ABO/RH(D): O POS
Antibody Screen: NEGATIVE

## 2020-02-11 LAB — ABO/RH: ABO/RH(D): O POS

## 2020-02-11 SURGERY — SALPINGO-OOPHORECTOMY, ROBOT-ASSISTED
Anesthesia: General | Laterality: Right

## 2020-02-11 MED ORDER — SCOPOLAMINE 1 MG/3DAYS TD PT72
1.0000 | MEDICATED_PATCH | TRANSDERMAL | Status: DC
  Administered 2020-02-11: 1.5 mg via TRANSDERMAL
  Filled 2020-02-11: qty 1

## 2020-02-11 MED ORDER — PHENYLEPHRINE HCL (PRESSORS) 10 MG/ML IV SOLN
INTRAVENOUS | Status: AC
  Filled 2020-02-11: qty 1

## 2020-02-11 MED ORDER — PROPOFOL 10 MG/ML IV BOLUS
INTRAVENOUS | Status: DC | PRN
  Administered 2020-02-11: 120 mg via INTRAVENOUS

## 2020-02-11 MED ORDER — FENTANYL CITRATE (PF) 250 MCG/5ML IJ SOLN
INTRAMUSCULAR | Status: AC
  Filled 2020-02-11: qty 5

## 2020-02-11 MED ORDER — KETAMINE HCL 10 MG/ML IJ SOLN
INTRAMUSCULAR | Status: AC
  Filled 2020-02-11: qty 1

## 2020-02-11 MED ORDER — LIDOCAINE HCL 2 % IJ SOLN
INTRAMUSCULAR | Status: AC
  Filled 2020-02-11: qty 20

## 2020-02-11 MED ORDER — OXYCODONE HCL 5 MG PO TABS: 5.0000 mg | ORAL_TABLET | Freq: Once | ORAL | Status: DC | PRN

## 2020-02-11 MED ORDER — CELECOXIB 200 MG PO CAPS
400.0000 mg | ORAL_CAPSULE | ORAL | Status: AC
  Administered 2020-02-11: 400 mg via ORAL
  Filled 2020-02-11: qty 2

## 2020-02-11 MED ORDER — KETOROLAC TROMETHAMINE 30 MG/ML IJ SOLN
INTRAMUSCULAR | Status: DC | PRN
  Administered 2020-02-11: 30 mg via INTRAVENOUS

## 2020-02-11 MED ORDER — DEXAMETHASONE SODIUM PHOSPHATE 4 MG/ML IJ SOLN: 4.0000 mg | INTRAMUSCULAR | Status: DC

## 2020-02-11 MED ORDER — ORAL CARE MOUTH RINSE: 15.0000 mL | Freq: Once | OROMUCOSAL | Status: AC

## 2020-02-11 MED ORDER — LIDOCAINE 2% (20 MG/ML) 5 ML SYRINGE
INTRAMUSCULAR | Status: DC | PRN
  Administered 2020-02-11: 1 mg/kg/h via INTRAVENOUS

## 2020-02-11 MED ORDER — EPHEDRINE 5 MG/ML INJ
INTRAVENOUS | Status: AC
  Filled 2020-02-11: qty 10

## 2020-02-11 MED ORDER — ONDANSETRON HCL 4 MG/2ML IJ SOLN
INTRAMUSCULAR | Status: AC
  Filled 2020-02-11: qty 2

## 2020-02-11 MED ORDER — FENTANYL CITRATE (PF) 250 MCG/5ML IJ SOLN
INTRAMUSCULAR | Status: DC | PRN
Start: 2020-02-11 — End: 2020-02-11
  Administered 2020-02-11 (×3): 50 ug via INTRAVENOUS

## 2020-02-11 MED ORDER — DEXAMETHASONE SODIUM PHOSPHATE 10 MG/ML IJ SOLN
INTRAMUSCULAR | Status: DC | PRN
  Administered 2020-02-11: 4 mg via INTRAVENOUS

## 2020-02-11 MED ORDER — LACTATED RINGERS IR SOLN
Status: DC | PRN
  Administered 2020-02-11: 1000 mL

## 2020-02-11 MED ORDER — LIDOCAINE 2% (20 MG/ML) 5 ML SYRINGE
INTRAMUSCULAR | Status: AC
  Filled 2020-02-11: qty 5

## 2020-02-11 MED ORDER — LACTATED RINGERS IV SOLN: INTRAVENOUS | Status: DC

## 2020-02-11 MED ORDER — PROPOFOL 500 MG/50ML IV EMUL
INTRAVENOUS | Status: DC | PRN
  Administered 2020-02-11: 25 ug/kg/min via INTRAVENOUS

## 2020-02-11 MED ORDER — STERILE WATER FOR IRRIGATION IR SOLN
Status: DC | PRN
  Administered 2020-02-11: 1000 mL

## 2020-02-11 MED ORDER — PROPOFOL 10 MG/ML IV BOLUS
INTRAVENOUS | Status: AC
  Filled 2020-02-11: qty 20

## 2020-02-11 MED ORDER — HYDROMORPHONE HCL 1 MG/ML IJ SOLN: 0.2500 mg | INTRAMUSCULAR | Status: DC | PRN

## 2020-02-11 MED ORDER — OXYCODONE HCL 5 MG/5ML PO SOLN: 5.0000 mg | Freq: Once | ORAL | Status: DC | PRN

## 2020-02-11 MED ORDER — CHLORHEXIDINE GLUCONATE 0.12 % MT SOLN
15.0000 mL | Freq: Once | OROMUCOSAL | Status: AC
  Administered 2020-02-11: 15 mL via OROMUCOSAL

## 2020-02-11 MED ORDER — LIDOCAINE 2% (20 MG/ML) 5 ML SYRINGE
INTRAMUSCULAR | Status: DC | PRN
  Administered 2020-02-11: 80 mg via INTRAVENOUS

## 2020-02-11 MED ORDER — SODIUM CHLORIDE 0.9% FLUSH: 3.0000 mL | Freq: Two times a day (BID) | INTRAVENOUS | Status: DC

## 2020-02-11 MED ORDER — MIDAZOLAM HCL 2 MG/2ML IJ SOLN
INTRAMUSCULAR | Status: AC
  Filled 2020-02-11: qty 2

## 2020-02-11 MED ORDER — PROPOFOL 500 MG/50ML IV EMUL
INTRAVENOUS | Status: AC
  Filled 2020-02-11: qty 50

## 2020-02-11 MED ORDER — GABAPENTIN 300 MG PO CAPS
300.0000 mg | ORAL_CAPSULE | ORAL | Status: AC
  Administered 2020-02-11: 300 mg via ORAL
  Filled 2020-02-11: qty 1

## 2020-02-11 MED ORDER — BUPIVACAINE HCL 0.25 % IJ SOLN
INTRAMUSCULAR | Status: DC | PRN
  Administered 2020-02-11: 17 mL

## 2020-02-11 MED ORDER — KETOROLAC TROMETHAMINE 30 MG/ML IJ SOLN
INTRAMUSCULAR | Status: AC
  Filled 2020-02-11: qty 1

## 2020-02-11 MED ORDER — ONDANSETRON HCL 4 MG/2ML IJ SOLN
INTRAMUSCULAR | Status: DC | PRN
  Administered 2020-02-11 (×2): 4 mg via INTRAVENOUS

## 2020-02-11 MED ORDER — EPHEDRINE SULFATE-NACL 50-0.9 MG/10ML-% IV SOSY
PREFILLED_SYRINGE | INTRAVENOUS | Status: DC | PRN
  Administered 2020-02-11: 5 mg via INTRAVENOUS

## 2020-02-11 MED ORDER — ACETAMINOPHEN 500 MG PO TABS
1000.0000 mg | ORAL_TABLET | ORAL | Status: AC
  Administered 2020-02-11: 1000 mg via ORAL
  Filled 2020-02-11: qty 2

## 2020-02-11 MED ORDER — CEFAZOLIN SODIUM-DEXTROSE 2-4 GM/100ML-% IV SOLN
2.0000 g | INTRAVENOUS | Status: AC
  Administered 2020-02-11: 2 g via INTRAVENOUS
  Filled 2020-02-11: qty 100

## 2020-02-11 MED ORDER — DEXAMETHASONE SODIUM PHOSPHATE 10 MG/ML IJ SOLN
INTRAMUSCULAR | Status: AC
  Filled 2020-02-11: qty 1

## 2020-02-11 MED ORDER — ROCURONIUM BROMIDE 10 MG/ML (PF) SYRINGE
PREFILLED_SYRINGE | INTRAVENOUS | Status: DC | PRN
  Administered 2020-02-11: 60 mg via INTRAVENOUS
  Administered 2020-02-11: 10 mg via INTRAVENOUS

## 2020-02-11 MED ORDER — BUPIVACAINE HCL 0.25 % IJ SOLN
INTRAMUSCULAR | Status: AC
  Filled 2020-02-11: qty 1

## 2020-02-11 MED ORDER — ROCURONIUM BROMIDE 10 MG/ML (PF) SYRINGE
PREFILLED_SYRINGE | INTRAVENOUS | Status: AC
  Filled 2020-02-11: qty 10

## 2020-02-11 SURGICAL SUPPLY — 68 items
APPLICATOR SURGIFLO ENDO (HEMOSTASIS) IMPLANT
BACTOSHIELD CHG 4% 4OZ (MISCELLANEOUS) ×1
BAG LAPAROSCOPIC 12 15 PORT 16 (BASKET) ×2 IMPLANT
BAG RETRIEVAL 12/15 (BASKET) ×4
BLADE SURG SZ10 CARB STEEL (BLADE) IMPLANT
BLADE SURG SZ11 CARB STEEL (BLADE) ×2 IMPLANT
COVER BACK TABLE 60X90IN (DRAPES) ×2 IMPLANT
COVER TIP SHEARS 8 DVNC (MISCELLANEOUS) ×1 IMPLANT
COVER TIP SHEARS 8MM DA VINCI (MISCELLANEOUS) ×1
COVER WAND RF STERILE (DRAPES) IMPLANT
DECANTER SPIKE VIAL GLASS SM (MISCELLANEOUS) ×2 IMPLANT
DERMABOND ADVANCED (GAUZE/BANDAGES/DRESSINGS) ×1
DERMABOND ADVANCED .7 DNX12 (GAUZE/BANDAGES/DRESSINGS) ×1 IMPLANT
DRAPE ARM DVNC X/XI (DISPOSABLE) ×4 IMPLANT
DRAPE COLUMN DVNC XI (DISPOSABLE) ×1 IMPLANT
DRAPE DA VINCI XI ARM (DISPOSABLE) ×4
DRAPE DA VINCI XI COLUMN (DISPOSABLE) ×1
DRAPE SHEET LG 3/4 BI-LAMINATE (DRAPES) ×2 IMPLANT
DRAPE SURG IRRIG POUCH 19X23 (DRAPES) ×2 IMPLANT
DRSG OPSITE POSTOP 4X6 (GAUZE/BANDAGES/DRESSINGS) IMPLANT
DRSG OPSITE POSTOP 4X8 (GAUZE/BANDAGES/DRESSINGS) IMPLANT
ELECT PENCIL ROCKER SW 15FT (MISCELLANEOUS) IMPLANT
ELECT REM PT RETURN 15FT ADLT (MISCELLANEOUS) ×2 IMPLANT
GLOVE BIO SURGEON STRL SZ 6 (GLOVE) ×8 IMPLANT
GLOVE BIO SURGEON STRL SZ 6.5 (GLOVE) ×4 IMPLANT
GOWN STRL REUS W/ TWL LRG LVL3 (GOWN DISPOSABLE) ×4 IMPLANT
GOWN STRL REUS W/TWL LRG LVL3 (GOWN DISPOSABLE) ×4
HOLDER FOLEY CATH W/STRAP (MISCELLANEOUS) ×2 IMPLANT
IRRIG SUCT STRYKERFLOW 2 WTIP (MISCELLANEOUS) ×2
IRRIGATION SUCT STRKRFLW 2 WTP (MISCELLANEOUS) ×1 IMPLANT
KIT PROCEDURE DA VINCI SI (MISCELLANEOUS)
KIT PROCEDURE DVNC SI (MISCELLANEOUS) IMPLANT
KIT TURNOVER KIT A (KITS) IMPLANT
MANIPULATOR UTERINE 4.5 ZUMI (MISCELLANEOUS) ×2 IMPLANT
NEEDLE HYPO 22GX1.5 SAFETY (NEEDLE) ×2 IMPLANT
NEEDLE SPNL 18GX3.5 QUINCKE PK (NEEDLE) IMPLANT
OBTURATOR OPTICAL STANDARD 8MM (TROCAR) ×1
OBTURATOR OPTICAL STND 8 DVNC (TROCAR) ×1
OBTURATOR OPTICALSTD 8 DVNC (TROCAR) ×1 IMPLANT
PACK ROBOT GYN CUSTOM WL (TRAY / TRAY PROCEDURE) ×2 IMPLANT
PAD POSITIONING PINK XL (MISCELLANEOUS) ×2 IMPLANT
PORT ACCESS TROCAR AIRSEAL 12 (TROCAR) ×1 IMPLANT
PORT ACCESS TROCAR AIRSEAL 5M (TROCAR) ×1
POUCH SPECIMEN RETRIEVAL 10MM (ENDOMECHANICALS) IMPLANT
RETRACTOR LAPSCP 12X46 CVD (ENDOMECHANICALS) ×1 IMPLANT
RTRCTR LAPSCP 12X46 CVD (ENDOMECHANICALS) ×2
SCRUB CHG 4% DYNA-HEX 4OZ (MISCELLANEOUS) ×1 IMPLANT
SEAL CANN UNIV 5-8 DVNC XI (MISCELLANEOUS) ×3 IMPLANT
SEAL XI 5MM-8MM UNIVERSAL (MISCELLANEOUS) ×3
SET TRI-LUMEN FLTR TB AIRSEAL (TUBING) ×2 IMPLANT
SPONGE LAP 18X18 RF (DISPOSABLE) IMPLANT
SURGIFLO W/THROMBIN 8M KIT (HEMOSTASIS) IMPLANT
SUT MNCRL AB 4-0 PS2 18 (SUTURE) IMPLANT
SUT PDS AB 1 TP1 96 (SUTURE) IMPLANT
SUT VIC AB 0 CT1 27 (SUTURE)
SUT VIC AB 0 CT1 27XBRD ANTBC (SUTURE) IMPLANT
SUT VIC AB 2-0 CT1 27 (SUTURE)
SUT VIC AB 2-0 CT1 TAPERPNT 27 (SUTURE) IMPLANT
SUT VIC AB 4-0 PS2 18 (SUTURE) ×4 IMPLANT
SYR 10ML LL (SYRINGE) IMPLANT
SYR 20ML LL LF (SYRINGE) IMPLANT
TOWEL OR NON WOVEN STRL DISP B (DISPOSABLE) ×2 IMPLANT
TRAP SPECIMEN MUCUS 40CC (MISCELLANEOUS) ×2 IMPLANT
TRAY FOLEY MTR SLVR 16FR STAT (SET/KITS/TRAYS/PACK) ×2 IMPLANT
TROCAR XCEL NON-BLD 5MMX100MML (ENDOMECHANICALS) IMPLANT
UNDERPAD 30X36 HEAVY ABSORB (UNDERPADS AND DIAPERS) ×2 IMPLANT
WATER STERILE IRR 1000ML POUR (IV SOLUTION) ×2 IMPLANT
YANKAUER SUCT BULB TIP 10FT TU (MISCELLANEOUS) IMPLANT

## 2020-02-11 NOTE — Progress Notes (Signed)
IV started at 0620, patient lying in bed. Patient passed out shortly after IV stick.  Remained unconscious for several minutes.  VSS, monitored and stable during this time.  Kept patient on monitor.  Patient will moan but remains drowsy.

## 2020-02-11 NOTE — Discharge Instructions (Signed)
Return to work: 1 - 2 weeks   Activity: 1. Be up and out of the bed during the day.  Take a nap if needed.  You may walk up steps but be careful and use the hand rail.  Stair climbing will tire you more than you think, you may need to stop part way and rest.   2. No lifting or straining for 4 weeks.  3. No driving for 3 days.  Do Not drive if you are taking narcotic pain medicine.  4. Shower daily.  Use soap and water on your incision and pat dry; don't rub.     Medications:  - Take ibuprofen and tylenol first line for pain control. Take these regularly (every 6 hours) to decrease the build up of pain.  - If necessary, for severe pain not relieved by ibuprofen, contact Dr Serita Grit office and you will be prescribed percocet.  - While taking percocet you should take sennakot every night to reduce the likelihood of constipation. If this causes diarrhea, stop its use.  Diet: 1. Low sodium Heart Healthy Diet is recommended.  2. It is safe to use a laxative if you have difficulty moving your bowels.   Wound Care: 1. Keep clean and dry.  Shower daily.  Reasons to call the Doctor:   Fever - Oral temperature greater than 100.4 degrees Fahrenheit  Foul-smelling vaginal discharge  Difficulty urinating  Nausea and vomiting  Increased pain at the site of the incision that is unrelieved with pain medicine.  Difficulty breathing with or without chest pain  New calf pain especially if only on one side  Sudden, continuing increased vaginal bleeding with or without clots.   Follow-up: 1. See Everitt Amber in 4 weeks.  Contacts: For questions or concerns you should contact:  Dr. Everitt Amber at [phone] After hours and on week-ends call [phone] and ask to speak to the physician on call for Gynecologic Oncology   Salpingooforectoma unilateral, cuidados posteriores Unilateral Salpingo-Oophorectomy, Care After [place] esta informacin sobre cmo cuidarse despus del  procedimiento. Su mdico tambin podr darle indicaciones ms especficas. Comunquese con su mdico si tiene problemas o preguntas. Qu puedo esperar despus del procedimiento? Despus del procedimiento, es comn [employer] siguientes sntomas:  Dolor abdominal.  Algn sangrado vaginal ocasional (pequeas manchas).  Cansancio. Siga estas indicaciones en su casa: Cuidados de la incisin   Mantenga la zona de la incisin y el vendaje limpios y secos.  Siga las indicaciones del mdico acerca del cuidado de la incisin. Haga lo siguiente: ? Lvese las manos con agua y [place] antes de cambiar el vendaje. Use desinfectante para manos si no dispone de [place] y [place]. ? Cambie el vendaje como se lo haya indicado el mdico. ? No retire los puntos (suturas), las grapas, la goma para cerrar la piel o las tiras [place]. Es posible que estos cierres cutneos Animal nutritionist en la piel durante 2semanas o ms tiempo. Si los bordes de las tiras adhesivas empiezan a despegarse y Therapist, sports, puede recortar los que estn sueltos. No retire las tiras [facility] por completo a menos que el mdico se lo indique.  [place] zona de la incisin para detectar signos de infeccin. Est atento a los siguientes signos: ? Dolor, hinchazn o enrojecimiento. ? Lquido o sangre. ? Calor. ? Pus o mal olor. Actividad  No conduzca ni use maquinaria pesada mientras toma analgsicos recetados.  No conduzca durante 24horas si le administraron un medicamento para ayudarlo  a que se relaje (sedante).  Haga caminatas cortas y frecuentes Agricultural consultant. Descanse cuando se sienta cansada. Pregntele al mdico qu actividades son seguras para usted.  Evite actividades que requieran un gran esfuerzo. Tambin, evite levantar pesos [employer]. No levante ningn objeto que pese ms de 5libras (2,3kg) o el lmite de peso que le indique su mdico hasta que este le diga que puede [place].  No se haga duchas  vaginales, no use tampones ni tenga relaciones sexuales hasta que su mdico lo autorice. Instrucciones generales  A fin de prevenir o tratar el estreimiento mientras toma analgsicos recetados, el mdico puede recomendarle lo siguiente: ? Beber suficiente lquido para mantener la orina de color amarillo plido. ? Tomar medicamentos recetados o de [employer]. ? Consumir alimentos ricos en fibra, como frutas y verduras frescas, cereales integrales y frijoles. ? Limitar el consumo de alimentos ricos en grasas y azcares procesados, como alimentos fritos o dulces.  Tome los medicamentos de venta libre y los recetados solamente como se lo haya indicado el mdico.  No tome baos de inmersin, no nade ni use el jacuzzi hasta que el mdico lo autorice. Pregntele al mdico si puede ducharse. Thurston Pounds solo le permitan darse baos de [place].  Use las medias de compresin como se lo haya indicado el mdico. Estas medias ayudan a Mining engineer formacin de cogulos de [place] y a reducir la hinchazn de las piernas.  Concurra a todas las visitas de control como se lo haya indicado el mdico. Esto es importante. Comunquese con un mdico si:  Siente dolor al [employer].  Tiene una secrecin con feo olor o pus que proviene de la vagina.  Tiene enrojecimiento, hinchazn o dolor alrededor de la incisin.  Le sale lquido o sangre de la incisin.  La incisin est caliente al tacto.  Tiene pus o percibe que sale mal olor del lugar de la incisin.  Tiene fiebre.  La incisin comienza a abrirse.  Tiene un dolor abdominal que empeora o que no mejora con los medicamentos.  Le aparece una erupcin cutnea.  Presenta nuseas o vmitos.  Se siente mareado. Solicite ayuda de inmediato si:  Tree surgeon en el pecho o en la pierna.  Le falta el aire.  Se desmaya.  Observa un aumento de la hemorragia vaginal. Resumen  Despus del procedimiento, es comn Patent attorney, cansancio y sangrado ocasional de  la vagina.  Siga las indicaciones del mdico acerca del cuidado de la incisin.  Controle la incisin todos los das para detectar signos de infeccin e informe sobre cualquier sntoma a su mdico.  Siga las indicaciones del mdico respecto de las actividades y Futures trader. Esta informacin no tiene Marine scientist el consejo del mdico. Asegrese de hacerle al mdico cualquier pregunta que tenga. Document Revised: 12/13/2016 Document Reviewed: 12/13/2016 Elsevier Patient Education  [place] Anesthesia, Adult, Care After This sheet gives you information about how to care for yourself after your procedure. Your health care provider may also give you more specific instructions. If you have problems or questions, contact your health care provider. What can I expect after the procedure? After the procedure, the following side effects are common:  Pain or discomfort at the IV site.  Nausea.  Vomiting.  Sore throat.  Trouble concentrating.  Feeling cold or chills.  Weak or tired.  Sleepiness and fatigue.  Soreness and body aches. These side effects can affect parts of the body that were not  involved in surgery. Follow these instructions at home:  For at least 24 hours after the procedure:  Have a responsible adult stay with you. It is important to have someone help care for you until you are awake and alert.  Rest as needed.  Do not: ? Participate in activities in which you could fall or become injured. ? Drive. ? Use heavy machinery. ? Drink alcohol. ? Take sleeping pills or medicines that cause drowsiness. ? Make important decisions or sign legal documents. ? Take care of children on your own. Eating and drinking  Follow any instructions from your health care provider about eating or drinking restrictions.  When you feel hungry, start by eating small amounts of foods that are soft and easy to digest (bland), such as toast. Gradually  return to your regular diet.  Drink enough fluid to keep your urine pale yellow.  If you vomit, rehydrate by drinking water, juice, or clear broth. General instructions  If you have sleep apnea, surgery and certain medicines can increase your risk for breathing problems. Follow instructions from your health care provider about wearing your sleep device: ? Anytime you are sleeping, including during daytime naps. ? While taking prescription pain medicines, sleeping medicines, or medicines that make you drowsy.  Return to your normal activities as told by your health care provider. Ask your health care provider what activities are safe for you.  Take over-the-counter and prescription medicines only as told by your health care provider.  If you smoke, do not smoke without supervision.  Keep all follow-up visits as told by your health care provider. This is important. Contact a health care provider if:  You have nausea or vomiting that does not get better with medicine.  You cannot eat or drink without vomiting.  You have pain that does not get better with medicine.  You are unable to pass urine.  You develop a skin rash.  You have a fever.  You have redness around your IV site that gets worse. Get help right away if:  You have difficulty breathing.  You have chest pain.  You have blood in your urine or stool, or you vomit blood. Summary  After the procedure, it is common to have a sore throat or nausea. It is also common to feel tired.  Have a responsible adult stay with you for the first 24 hours after general anesthesia. It is important to have someone help care for you until you are awake and alert.  When you feel hungry, start by eating small amounts of foods that are soft and easy to digest (bland), such as toast. Gradually return to your regular diet.  Drink enough fluid to keep your urine pale yellow.  Return to your normal activities as told by your health care  provider. Ask your health care provider what activities are safe for you. This information is not intended to replace advice given to you by your health care provider. Make sure you discuss any questions you have with your health care provider. Document Revised: 05/12/2017 Document Reviewed: 12/23/2016 Elsevier Patient Education  [place].

## 2020-02-11 NOTE — Op Note (Signed)
OPERATIVE NOTE  Date: 02/11/20  Preoperative Diagnosis: ovarian cyst   Postoperative Diagnosis:  Same and right ovarian cystadenofibroma (benign)  Procedure(s) Performed: Robotic-assisted laparoscopic right salpingo-oophorectomy  Surgeon: Everitt Amber, M.D.  Assistant Surgeon: Lahoma Crocker M.D. (an MD assistant was necessary for tissue manipulation, management of robotic instrumentation, retraction and positioning due to the complexity of the case and hospital policies).   Anesthesia: Gen. endotracheal.  Specimens: Right tube and ovary, pelvic washings, umbilical skin  Estimated Blood Loss: 10 mL. Blood Replacement: None  Complications: none  Indication for Procedure:  15cm ovarian cyst (complex)  Operative Findings: complex right ovarian cyst (smooth), benign appearing, normal left tube and ovary, normal uterus. Normal upper abdomen.  Frozen pathology was consistent with benign ovarian cyst.  Procedure: The patient's taken to the operating room and placed under general endotracheal anesthesia testing difficulty. She is placed in a dorsolithotomy position and cervical acromial pad was placed. The arms were tucked with care taken to pad the olecranon process. And prepped and draped in usual sterile fashion. A uterine manipulator (zumi) was placed vaginally and penetrated the uterine fundus. A 13mm incision was made in the left upper quadrant palmer's point and a 5 mm Optiview trocar used to enter the abdomen under direct visualization. With entry into the abdomen and then maintenance of 15 mm of mercury the patient was placed in Trendelenburg position. An incision was made in the umbilicus and a 16WV trochar was placed through this site. Two incisions were made lateral to the umbilical incision in the left and right abdomen measuring 89mm. These incisions were made approximately 10 cm lateral to the umbilical incision. 8 mm robotic trochars were inserted. The robot was docked.  The 30  degree scope and a laparoscopic fan retractor were necessary to obtain visualization of the ovarian vasculature.  The abdomen was inspected as was the pelvis.  Pelvic washings were obtained. An incision was made on the right pelvic side wall peritoneum parallel to the IP ligament and the retroperitoneal space entered. The right ureter was identified and the para-rectal space was developed. A window was created in the right broad ligament above the ureter. The right infundibulopelvic vessels were skeletonized cauterized and transected. The utero-ovarian ligaments similarly were cauterized and transected. Specimen was placed in an Endo Catch bag. During placement of the specimen in the bag there was unavoidable rupture of the specimen.  Irrigation took place and hemostasis was ensured.   The robot was undocked. The contents of the Endo Catch bag were first aspirated and then morcellated within the bag to facilitate removal from the abdominal cavity through the umbilical incision. Frozen section revealed a benign cystadenofibroma.  The infraumbilical hypertrophic scar was excised and sent for pathology.  The ports were all remove. The fascial closure at the umbilical incision and left upper quadrant port was made with 0 Vicryl.  All incisions were closed with a running subcuticular Monocryl suture. Dermabond was applied. Sponge, lap and needle counts were correct x 3.    The patient had sequential compression devices for VTE prophylaxis.         Disposition: PACU          Condition: stable  Donaciano Eva, MD

## 2020-02-11 NOTE — Transfer of Care (Signed)
Immediate Anesthesia Transfer of Care Note  Patient: Casey Bauer  Procedure(s) Performed: XI ROBOTIC ASSISTED RIGHT SALPINGO OOPHORECTOMY (Right )  Patient Location: PACU  Anesthesia Type:General  Level of Consciousness: awake, oriented and drowsy  Airway & Oxygen Therapy: Patient Spontanous Breathing and Patient connected to face mask oxygen  Post-op Assessment: Report given to RN and Post -op Vital signs reviewed and stable  Post vital signs: Reviewed and stable  Last Vitals:  Vitals Value Taken Time  BP 119/74 02/11/20 0934  Temp    Pulse 80 02/11/20 0934  Resp 15 02/11/20 0934  SpO2 100 % 02/11/20 0934  Vitals shown include unvalidated device data.  Last Pain:  Vitals:   02/11/20 0612  TempSrc:   PainSc: 0-No pain         Complications: No complications documented.

## 2020-02-11 NOTE — Anesthesia Procedure Notes (Signed)
Procedure Name: Intubation Date/Time: 02/11/2020 7:37 AM Performed by: Maxwell Caul, CRNA Pre-anesthesia Checklist: Patient identified, Emergency Drugs available, Suction available and Patient being monitored Patient Re-evaluated:Patient Re-evaluated prior to induction Oxygen Delivery Method: Circle system utilized Preoxygenation: Pre-oxygenation with 100% oxygen Induction Type: IV induction Ventilation: Mask ventilation without difficulty Laryngoscope Size: Mac and 3 Grade View: Grade I Tube type: Oral Tube size: 7.0 mm Number of attempts: 1 Airway Equipment and Method: Stylet Placement Confirmation: ETT inserted through vocal cords under direct vision,  positive ETCO2 and breath sounds checked- equal and bilateral Secured at: 21 cm Tube secured with: Tape Dental Injury: Teeth and Oropharynx as per pre-operative assessment

## 2020-02-11 NOTE — Anesthesia Preprocedure Evaluation (Signed)
Anesthesia Evaluation  Patient identified by MRN, date of birth, ID band Patient awake    Reviewed: Allergy & Precautions, NPO status , Patient's Chart, lab work & pertinent test results  Airway Mallampati: II  TM Distance: >3 FB Neck ROM: Full    Dental no notable dental hx.    Pulmonary neg pulmonary ROS,    Pulmonary exam normal breath sounds clear to auscultation       Cardiovascular negative cardio ROS Normal cardiovascular exam Rhythm:Regular Rate:Normal     Neuro/Psych negative neurological ROS  negative psych ROS   GI/Hepatic negative GI ROS, Neg liver ROS,   Endo/Other  negative endocrine ROS  Renal/GU negative Renal ROS  negative genitourinary   Musculoskeletal negative musculoskeletal ROS (+)   Abdominal   Peds negative pediatric ROS (+)  Hematology negative hematology ROS (+)   Anesthesia Other Findings   Reproductive/Obstetrics negative OB ROS                             Anesthesia Physical Anesthesia Plan  ASA: II  Anesthesia Plan: General   Post-op Pain Management:    Induction: Intravenous  PONV Risk Score and Plan: 3 and Ondansetron, Dexamethasone and Treatment may vary due to age or medical condition  Airway Management Planned: Oral ETT  Additional Equipment:   Intra-op Plan:   Post-operative Plan: Extubation in OR  Informed Consent: I have reviewed the patients History and Physical, chart, labs and discussed the procedure including the risks, benefits and alternatives for the proposed anesthesia with the patient or authorized representative who has indicated his/her understanding and acceptance.     Dental advisory given  Plan Discussed with: CRNA and Surgeon  Anesthesia Plan Comments: (Patient had vagal episode during IV placement. Still somnolent, vitals stable, answers questions appropriately. Agrees to proceed)        Anesthesia Quick  Evaluation

## 2020-02-11 NOTE — Anesthesia Postprocedure Evaluation (Signed)
Anesthesia Post Note  Patient: Genelle Economou  Procedure(s) Performed: XI ROBOTIC ASSISTED RIGHT SALPINGO OOPHORECTOMY (Right )     Patient location during evaluation: PACU Anesthesia Type: General Level of consciousness: awake and alert Pain management: pain level controlled Vital Signs Assessment: post-procedure vital signs reviewed and stable Respiratory status: spontaneous breathing, nonlabored ventilation, respiratory function stable and patient connected to nasal cannula oxygen Cardiovascular status: blood pressure returned to baseline and stable Postop Assessment: no apparent nausea or vomiting Anesthetic complications: no   No complications documented.  Last Vitals:  Vitals:   02/11/20 1015 02/11/20 1030  BP: 111/68 103/71  Pulse: 63 66  Resp: 10 10  Temp: (!) 35.9 C (!) 36.2 C  SpO2: 100% 100%    Last Pain:  Vitals:   02/11/20 1030  TempSrc:   PainSc: Asleep                 Areta Terwilliger S

## 2020-02-11 NOTE — Interval H&P Note (Signed)
History and Physical Interval Note:  02/11/2020 7:21 AM  Casey Bauer  has presented today for surgery, with the diagnosis of LEFT OVARIAN CYST.  The various methods of treatment have been discussed with the patient and family. After consideration of risks, benefits and other options for treatment, the patient has consented to  Procedure(s): XI ROBOTIC [place]; POSSIBLE STAGING (N/A) as a surgical intervention.  The patient's history has been reviewed, patient examined, no change in status, stable for surgery.  I have reviewed the patient's chart and labs.  Questions were answered to the patient's satisfaction.     Thereasa Solo

## 2020-02-12 ENCOUNTER — Encounter (HOSPITAL_COMMUNITY): Payer: Self-pay | Admitting: Gynecologic Oncology

## 2020-02-12 ENCOUNTER — Telehealth: Payer: Self-pay

## 2020-02-12 NOTE — Telephone Encounter (Signed)
Post op call made to patient.  Ms. Michalsky is doing well since surgery, had some pain this morning relieved by oxycodone.  Patient stated she tolerated po today and is drinking plenty of water.  Voiding without difficulty, but has not had BM since surgery.  Will start taking prescribed senna today.  Patient denied fever and stated all incisions look fine.  Patient has follow up appointment on March 03, 2020 @ 3pm.  Patient has numbers to call in case of questions or urgency.

## 2020-02-14 ENCOUNTER — Telehealth: Payer: Self-pay

## 2020-02-14 ENCOUNTER — Ambulatory Visit (HOSPITAL_COMMUNITY)
Admission: RE | Admit: 2020-02-14 | Discharge: 2020-02-14 | Disposition: A | Discharge status: Home / Self Care | Payer: 59 | Source: Ambulatory Visit | Attending: Obstetrics & Gynecology | Admitting: Obstetrics & Gynecology

## 2020-02-14 ENCOUNTER — Other Ambulatory Visit: Payer: Self-pay

## 2020-02-14 DIAGNOSIS — E041 Nontoxic single thyroid nodule: Secondary | ICD-10-CM | POA: Diagnosis not present

## 2020-02-14 IMAGING — US US THYROID
1 series · 13 of 25 positions shown · non-contrast
Comparison: None.

CLINICAL DATA: Nontoxic single thyroid nodule.

EXAM:
THYROID ULTRASOUND
TECHNIQUE: Ultrasound examination of the thyroid gland and adjacent soft
tissues was performed.

[Series 1: us thyroid · 13 of 46 slices shown]
[im 1/46]
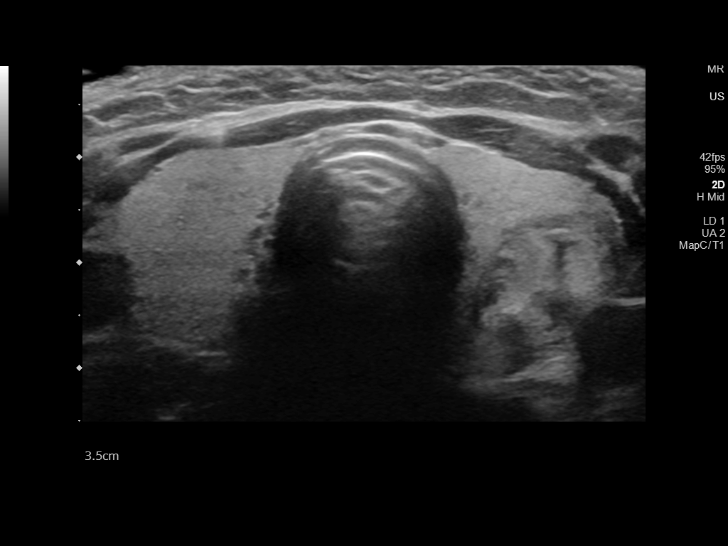
[im 4/46]
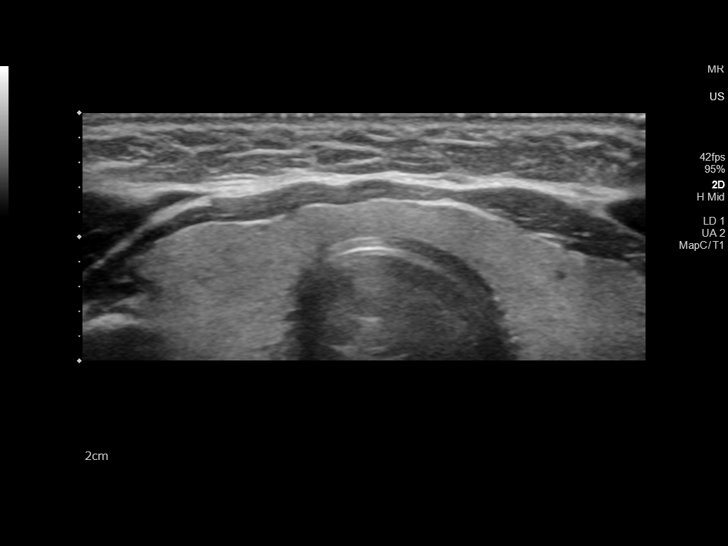
[im 8/46]
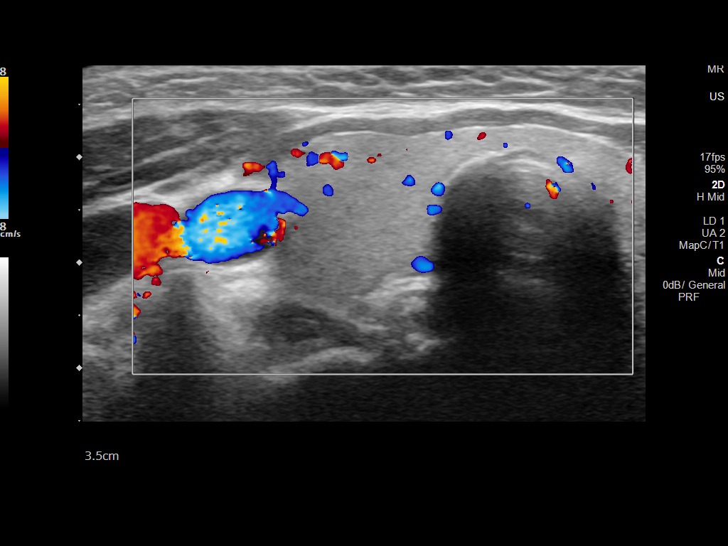
[im 12/46]
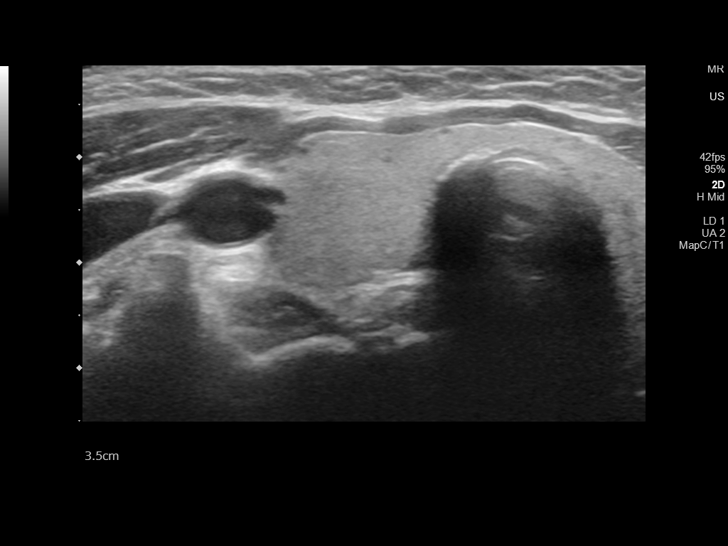
[im 16/46]
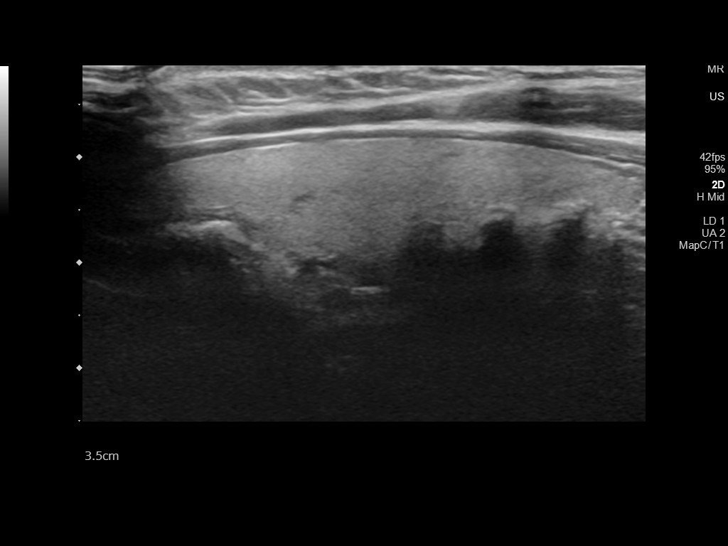
[im 19/46]
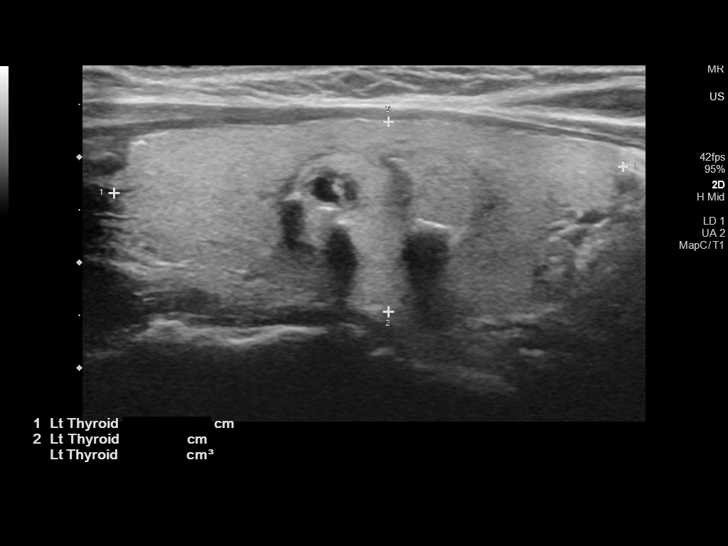
[im 23/46]
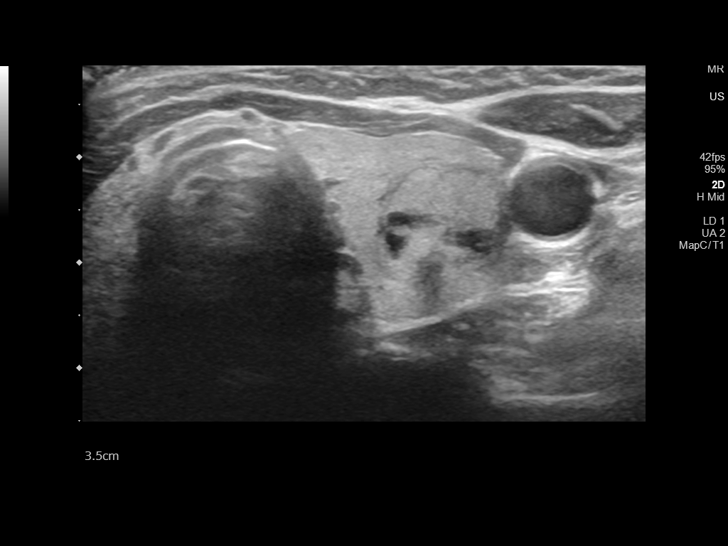
[im 27/46]
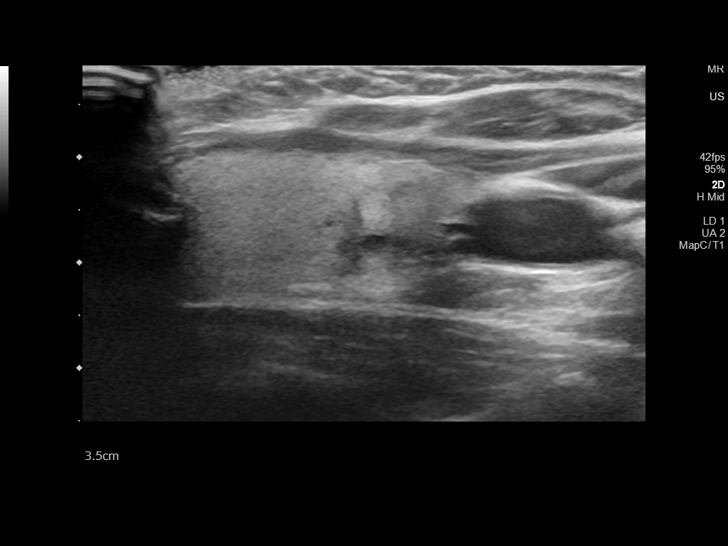
[im 31/46]
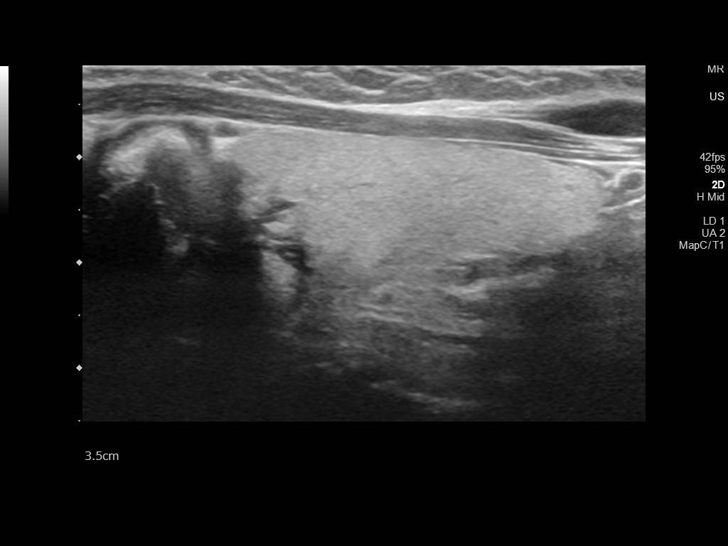
[im 34/46]
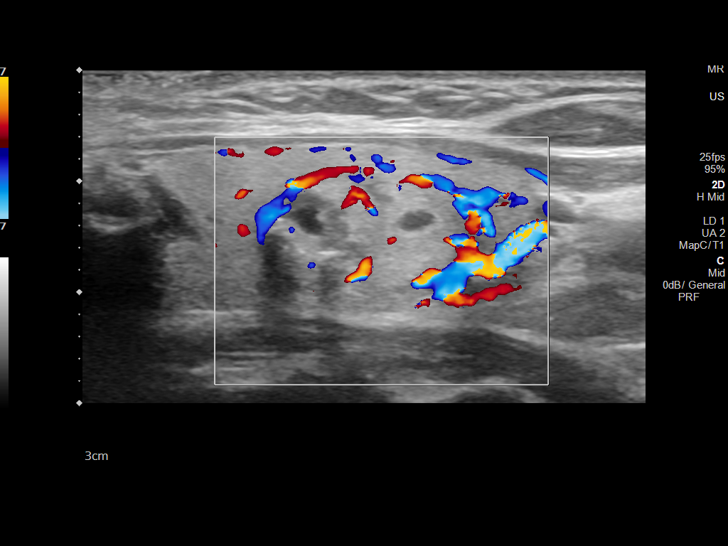
[im 38/46]
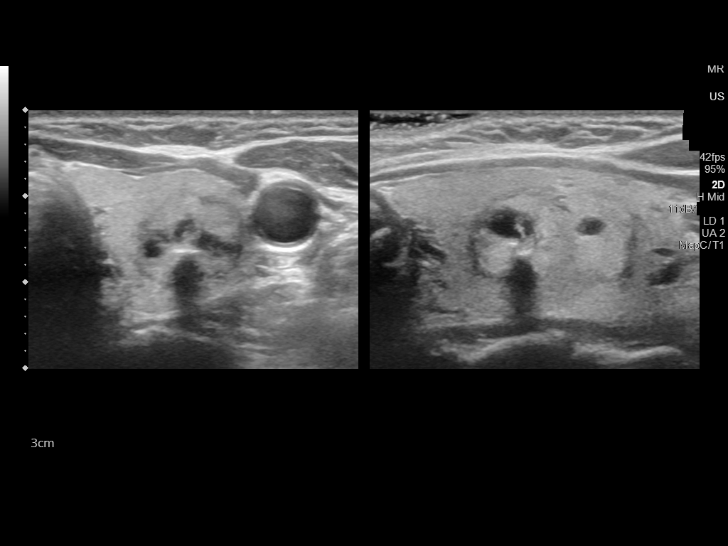
[im 42/46]
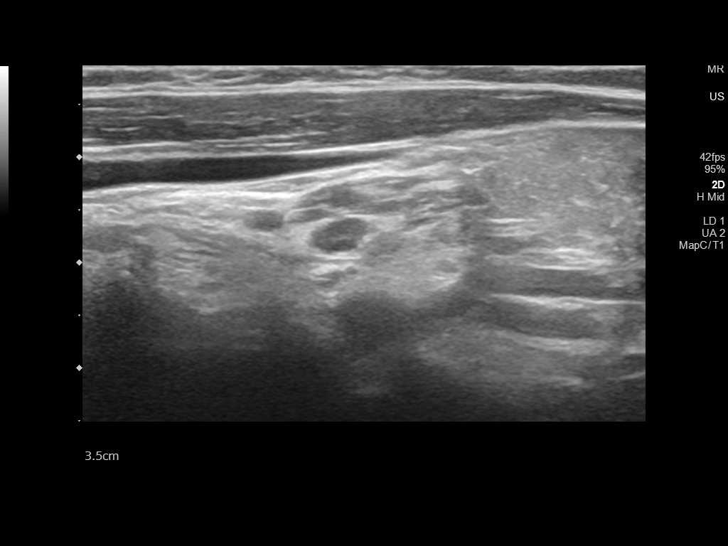
[im 46/46]
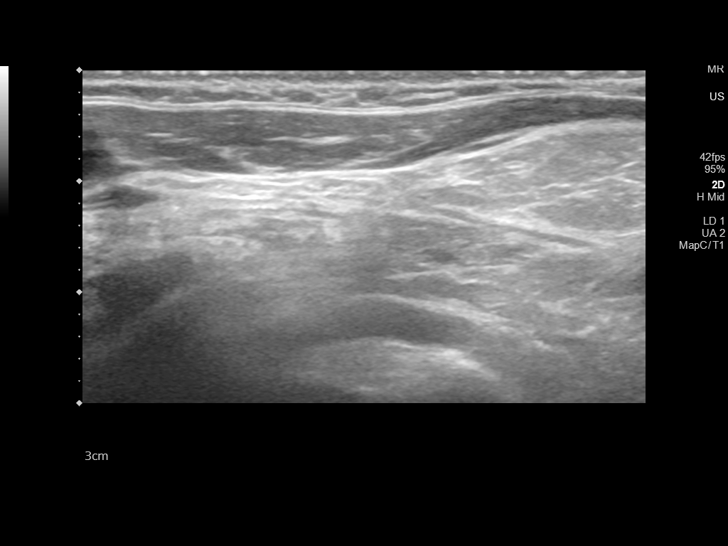

[13 of 25 positions shown; findings below may reference images not displayed]

FINDINGS: Parenchymal Echotexture: Normal

Isthmus: 0.3 cm

Right lobe: 5 x 1.8 x 1.5 cm

Left lobe: 4.8 x 1.8 x 1.6 cm

_________________________________________________________

Estimated total number of nodules >/= 1 cm: 1

Number of spongiform nodules >/=  2 cm not described below (TR1): 0

Number of mixed cystic and solid nodules >/= 1.5 cm not described
below (TR2): 0

_________________________________________________________

Nodule # 1:

Location: Left; Mid

Maximum size: 1.9 cm; Other 2 dimensions: 1.2 x 1.3 cm

Composition: solid/almost completely solid (2)

Echogenicity: isoechoic (1)

Shape: not taller-than-wide (0)

Margins: ill-defined (0)

Echogenic foci: macrocalcifications (1)

ACR TI-RADS total points: 4.

ACR TI-RADS risk category: TR4 (4-6 points).

ACR TI-RADS recommendations:

**Given size (>/= 1.5 cm) and appearance, fine needle aspiration of
this moderately suspicious nodule should be considered based on
TI-RADS criteria.

_________________________________________________________
IMPRESSION: There is a 1.9 cm moderately suspicious TR4 thyroid nodule in the
left mid thyroid gland. Fine-needle aspiration is recommended for
this thyroid nodule.

The above is in keeping with the ACR TI-RADS recommendations - [HOSPITAL] [C8];[DATE].

## 2020-02-14 NOTE — Telephone Encounter (Signed)
Told Ms Poss that the final pathology showed no cancer. It was benign pathology. Verified with Melissa Cross,NP that the cyst was on the right side and not on left. The [place] image shows the cyst but not necessarily on the correct side. The surgical visualization is the definitive determinate. Pt verbalized understanding.

## 2020-02-18 LAB — CYTOLOGY - NON PAP

## 2020-02-18 LAB — SURGICAL PATHOLOGY

## 2020-03-03 ENCOUNTER — Encounter: Payer: Self-pay | Admitting: Gynecologic Oncology

## 2020-03-03 ENCOUNTER — Other Ambulatory Visit: Payer: Self-pay

## 2020-03-03 ENCOUNTER — Inpatient Hospital Stay: Payer: 59 | Attending: Gynecologic Oncology | Admitting: Gynecologic Oncology

## 2020-03-03 VITALS — BP 136/75 | HR 73 | Temp 97.6°F | Resp 18 | Wt 102.6 lb

## 2020-03-03 DIAGNOSIS — R188 Other ascites: Secondary | ICD-10-CM | POA: Diagnosis not present

## 2020-03-03 DIAGNOSIS — Z90721 Acquired absence of ovaries, unilateral: Secondary | ICD-10-CM | POA: Diagnosis not present

## 2020-03-03 DIAGNOSIS — Z7189 Other specified counseling: Secondary | ICD-10-CM | POA: Diagnosis not present

## 2020-03-03 DIAGNOSIS — R180 Malignant ascites: Secondary | ICD-10-CM

## 2020-03-03 DIAGNOSIS — N83202 Unspecified ovarian cyst, left side: Secondary | ICD-10-CM | POA: Insufficient documentation

## 2020-03-03 NOTE — Patient Instructions (Signed)
Dr Denman George is ordering a CT scan because the washings from the abdominal cavity which were taken during your surgery showed "atypical cells".  If this CT scan is negative, you should proceed with getting colonoscopy and mammography screening up to date to ensure there is no occult colon or breast cancer.  If these tests are all negative, the 2 options for proceeding are:   1/ proceed with hysterectomy and removal of the left tube and ovary to look for very small level disease.   2/ proceed with monitoring of the ovary and uterus with ultrasounds every 6 months at Dr Serita Grit office.  Dr Serita Grit office can be reached at [phone].

## 2020-03-03 NOTE — Progress Notes (Signed)
Follow-up Note: Gyn-Onc  Consult was requested by Dr. Lynnette Caffey for the evaluation of Casey Bauer 59 y.o. female  CC:  Chief Complaint  Patient presents with  . Left ovarian cyst    Assessment/Plan:  Casey Bauer  is a 59 y.o.  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   I discussed with the patient that the benign ovarian cyst does not need any further intervention, however attention should be placed with the atypical cells found in the peritoneal washings.  I explained that I do not have a clear etiology for these.  Potential sources could be a microscopic lesion present in the left tube and ovary or uterus.  Alternatively this could be peritoneal spread of atypical cells from another source.  I recommend that she get up-to-date with her colonoscopy and mammogram screening.  I have ordered a CT scan of the abdomen and pelvis to evaluate for intrapulmonary parenchymal disease that may have been overlooked during her peritoneal surgery.  I have recommended the following 2 options pending normalcy in the above-stated imaging: 1/ proceed with completion hysterectomy and LSO to confirm there is no STIC or early preinvasive or invasive lesion in the uterus or left tube and ovary.  I explained that the upside of this intervention is somewhat comfort regarding occult gynecologic malignancy not being present and resection if 1 is.  The downside is subjecting patient to a second surgery, though this could certainly be accomplished through minimally invasive route with robotic assistance.  2/proceed with close follow-up with 6 monthly ultrasound scans pelvic examinations and Ca1 25 assessments for a 5-year duration.  She will consider her options and notify us after she is completed further work-up with scans and mammography regarding what she would like to do moving forward.   HPI: Casey Bauer is a  59 year old P1 who was seen in consultation at the request of Dr. Lynnette Caffey for evaluation of a complex left ovarian cyst.  She was seen by Dr. Lynnette Caffey on February 04, 2020 for routine gynecologic evaluation.  During that time an endocervical polyp was seen on speculum exam which was grasped and removed and sent for pathology.  At the time her initial consultation with me that pathology report was not available.  At the time of that examination her uterus was palpably enlarged and therefore the ordered a transvaginal ultrasound to be performed in the office.  This ultrasound was performed on the same day, February 04, 2020, and revealed a normal-sized uterus measuring 5.3 x 1.7 x 3.7 cm with an endometrial thickness of 1.1 mm.  The low right ovary was not seen.  The left ovary contained multiple cysts with the largest measuring 12.9 x 14 cm and additionally 3 and 4 cm cysts in aggregate.  There was no blood flow seen within the cyst.  A Ca1 25 was drawn the same day which was normal at 27.3.  Interval Hx:  On February 11, 2020 she underwent robotic assisted unilateral salpingo-oophorectomy.  Intraoperative findings were significant for a normal left tube and ovary, normal-appearing uterus, normal upper abdomen and peritoneal cavity.  There was a right ovarian cyst arising from the right ovary that was smooth in nature.  It measured approximately 15 cm.  This was a discrepancy between preoperative findings in which the cyst was described to be originating from the left side, however clearly the left side was normal and it was the right  side which featured pathology.  The left tube and ovary were removed and placed in Endo Catch bag.  Prior to the removal peritoneal washings were taken.  There was cyst disruption during placement in the Endo Catch bag, however this was after the peritoneal washings had been obtained.  Specimen was sent for frozen section which revealed a benign ovarian cystic mass.  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  However washings that had been taken at the beginning of the case revealed atypical cells present with psammoma bodies.  The entire right tube and ovary was sectioned and evaluated for possible small occult STIC lesion or malignancy.  None was seen.  Following surgery the patient overall did well with some fatigue but no other specific complaints.  Current Meds:  Outpatient Encounter Medications as of 03/03/2020  Medication Sig  . alendronate (FOSAMAX) 10 MG tablet Take 10 mg by mouth daily.  Marland Kitchen conjugated estrogens (PREMARIN) vaginal cream Place 1 Applicatorful vaginally once a week.   Marland Kitchen ibuprofen (ADVIL) 800 MG tablet Take 1 tablet (800 mg total) by mouth every 8 (eight) hours as needed for moderate pain. For AFTER surgery  . senna-docusate (SENOKOT-S) 8.6-50 MG tablet Take 2 tablets by mouth at bedtime. For AFTER surgery, do not take if having diarrhea  . ibuprofen (ADVIL) 200 MG tablet Take 400 mg by mouth every 6 (six) hours as needed for headache or moderate pain.  (Patient not taking: Reported on 03/03/2020)  . oxyCODONE (OXY IR/ROXICODONE) 5 MG immediate release tablet Take 1 tablet (5 mg total) by mouth every 4 (four) hours as needed for severe pain. For AFTER surgery, do not take and drive (Patient not taking: Reported on 03/03/2020)   No facility-administered encounter medications on file as of 03/03/2020.    Allergy: No Known Allergies  Social Hx:   Social History   Socioeconomic History  . Marital status: Married    Spouse name: Not on file  . Number of children: 1  . Years of education: Not on file  . Highest education level: Not on file  Occupational History  . Occupation: home maker  Tobacco Use  . Smoking status: Never Smoker  . Smokeless tobacco: Never Used  Vaping Use  . Vaping Use: Never used  Substance and Sexual Activity  . Alcohol use: Yes    Comment: occasional wine  . Drug use:  Never  . Sexual activity: Not on file  Other Topics Concern  . Not on file  Social History Narrative  . Not on file   Social Determinants of Health   Financial Resource Strain:   . Difficulty of Paying Living Expenses: Not on file  Food Insecurity:   . Worried About Charity fundraiser in the Last Year: Not on file  . Ran Out of Food in the Last Year: Not on file  Transportation Needs:   . Lack of Transportation (Medical): Not on file  . Lack of Transportation (Non-Medical): Not on file  Physical Activity:   . Days of Exercise per Week: Not on file  . Minutes of Exercise per Session: Not on file  Stress:   . Feeling of Stress : Not on file  Social Connections:   . Frequency of Communication with Friends and Family: Not on file  . Frequency of Social Gatherings with Friends and Family: Not on file  . Attends Religious Services: Not on file  . Active Member of Clubs or Organizations: Not on  file  . Attends Archivist Meetings: Not on file  . Marital Status: Not on file  Intimate Partner Violence:   . Fear of Current or Ex-Partner: Not on file  . Emotionally Abused: Not on file  . Physically Abused: Not on file  . Sexually Abused: Not on file    Past Surgical Hx:  Past Surgical History:  Procedure Laterality Date  . ARM SURGERY Right    fracture  . CHOLECYSTECTOMY N/A 10/12/2018   Procedure: LAPAROSCOPIC CHOLECYSTECTOMY;  Surgeon: Rolm Bookbinder, MD;  Location: [facility];  Service: General;  Laterality: N/A;  TAP BLOCK  . COLONOSCOPY    . ROBOTIC ASSISTED SALPINGO OOPHERECTOMY Right 02/11/2020   Procedure: XI ROBOTIC ASSISTED RIGHT SALPINGO OOPHORECTOMY;  Surgeon: Everitt Amber, MD;  Location: [facility];  Service: Gynecology;  Laterality: Right;  . UPPER GI ENDOSCOPY    . WISDOM TOOTH EXTRACTION      Past Medical Hx:  Past Medical History:  Diagnosis Date  . Complication of anesthesia    slow to wake up  . History of kidney stones   . IBS (irritable bowel  syndrome)   . Osteopenia   . Osteoporosis   . STD (female)     Past Gynecological History:  See HPI No LMP recorded. Patient is postmenopausal.  Family Hx:  Family History  Problem Relation Age of Onset  . Gallbladder disease Mother   . Cancer Mother        lung  . COPD Father   . Asthma Father   . Heart failure Father   . Gallbladder disease Sister   . Bone cancer Paternal Grandmother   . Gallbladder disease Sister     Review of Systems:  Constitutional  Feels well,    ENT Normal appearing ears and nares bilaterally Skin/Breast  No rash, sores, jaundice, itching, dryness Cardiovascular  No chest pain, shortness of breath, or edema  Pulmonary  No cough or wheeze.  Gastro Intestinal  No nausea, vomitting, or diarrhoea. No bright red blood per rectum, no abdominal pain, change in bowel movement, or constipation.  Genito Urinary  No frequency, urgency, dysuria, no bleeding Musculo Skeletal  No myalgia, arthralgia, joint swelling or pain  Neurologic  No weakness, numbness, change in gait,  Psychology  No depression, anxiety, insomnia.   Vitals:  Blood pressure 136/75, pulse 73, temperature 97.6 F (36.4 C), temperature source Tympanic, resp. rate 18, weight 102 lb 9.6 oz (46.5 kg), SpO2 100 %.  Physical Exam: WD in NAD Neck  Supple NROM, without any enlargements.  Lymph Node Survey No cervical supraclavicular or inguinal adenopathy Cardiovascular  Pulse normal rate, regularity and rhythm. S1 and S2 normal.  Lungs  Clear to auscultation bilateraly, without wheezes/crackles/rhonchi. Good air movement.  Skin  No rash/lesions/breakdown  Psychiatry  Alert and oriented to person, place, and time  Abdomen  Normoactive bowel sounds, abdomen soft, non-tender and thin without evidence of hernia. Well healed incisions. No distension Back No CVA tenderness Genito Urinary  deferred Rectal  Good tone, no masses no cul de sac nodularity.  Extremities  No bilateral  cyanosis, clubbing or edema.   30 minutes of direct face to face counseling time was spent with the patient. This included discussion about prognosis, therapy recommendations and postoperative side effects and are beyond the scope of routine postoperative care.  Thereasa Solo, MD  03/03/2020, 5:10 PM

## 2020-03-12 ENCOUNTER — Ambulatory Visit (HOSPITAL_COMMUNITY)
Admission: RE | Admit: 2020-03-12 | Discharge: 2020-03-12 | Disposition: A | Discharge status: Home / Self Care | Payer: 59 | Source: Ambulatory Visit | Attending: Gynecologic Oncology | Admitting: Gynecologic Oncology

## 2020-03-12 ENCOUNTER — Other Ambulatory Visit: Payer: Self-pay

## 2020-03-12 DIAGNOSIS — R180 Malignant ascites: Secondary | ICD-10-CM | POA: Insufficient documentation

## 2020-03-12 DIAGNOSIS — C569 Malignant neoplasm of unspecified ovary: Secondary | ICD-10-CM | POA: Insufficient documentation

## 2020-03-12 IMAGING — CT CT ABD-PELV W/ CM
2 of 5 series · 16 of 46 positions shown, 18 images · IV contrast (omnipaque)
Comparison: None.

CLINICAL DATA: Ovarian carcinoma.  Staging.

EXAM:
CT ABDOMEN AND PELVIS WITH CONTRAST
TECHNIQUE: Multidetector CT imaging of the abdomen and pelvis was performed
using the standard protocol following bolus administration of
intravenous contrast.
CONTRAST:  100mL OMNIPAQUE IOHEXOL 300 MG/ML  SOLN

[Series 2: axial st · axial · 0.67mm/px · z∈[-445,-50]mm · 13 of 93 slices shown, 15 images]
[im 7/93  soft-tissue]
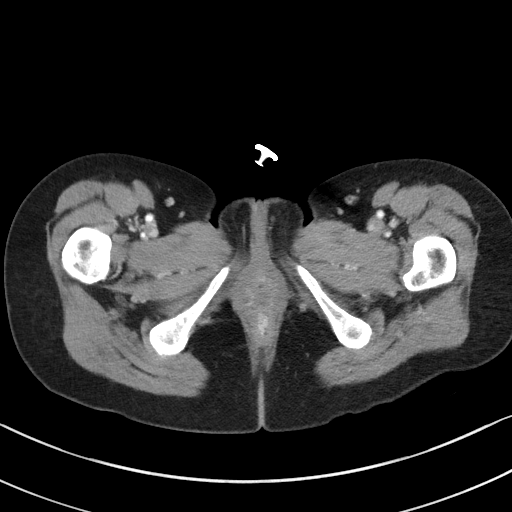
[im 7/93  bone]
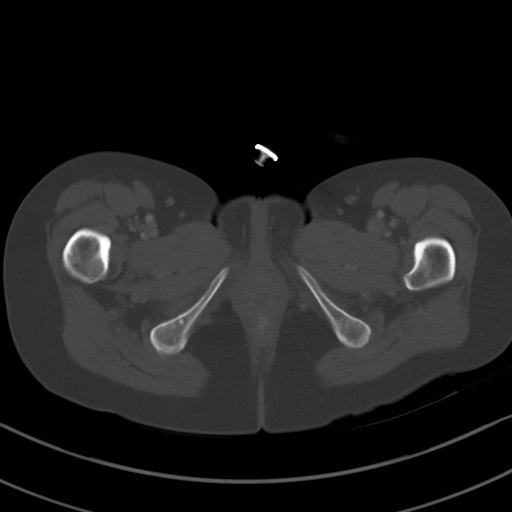
[im 13/93  soft-tissue]
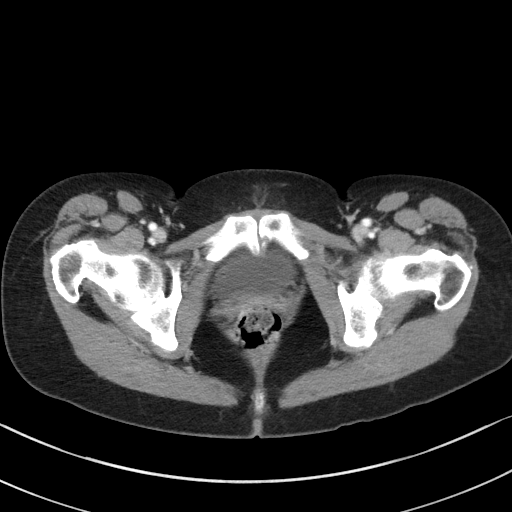
[im 19/93  soft-tissue]
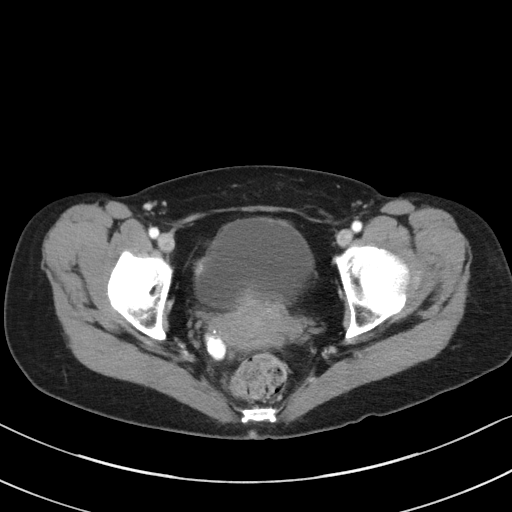
[im 25/93  soft-tissue]
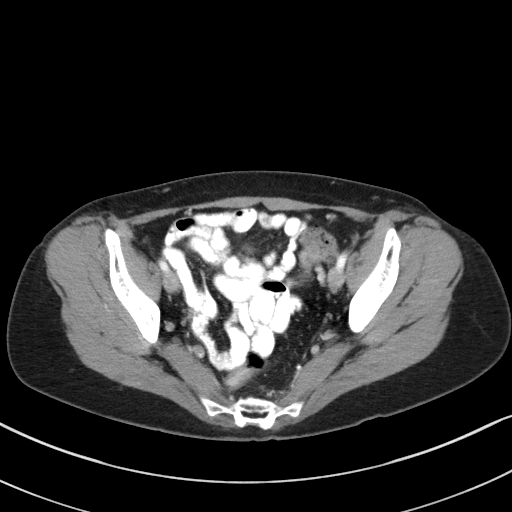
[im 31/93  soft-tissue]
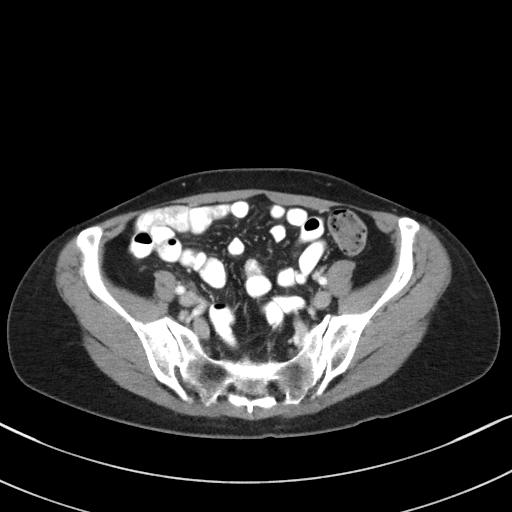
[im 37/93  soft-tissue]
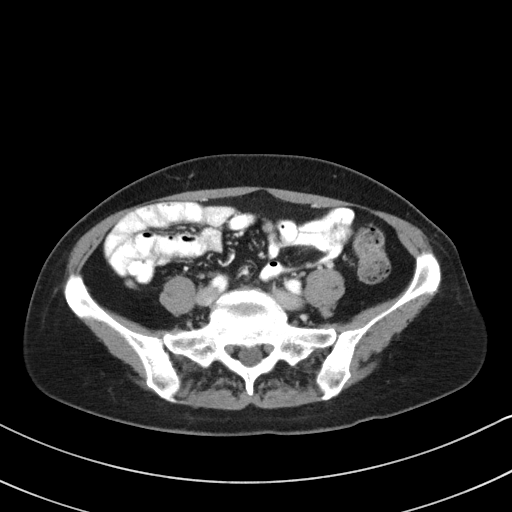
[im 50/93  soft-tissue]
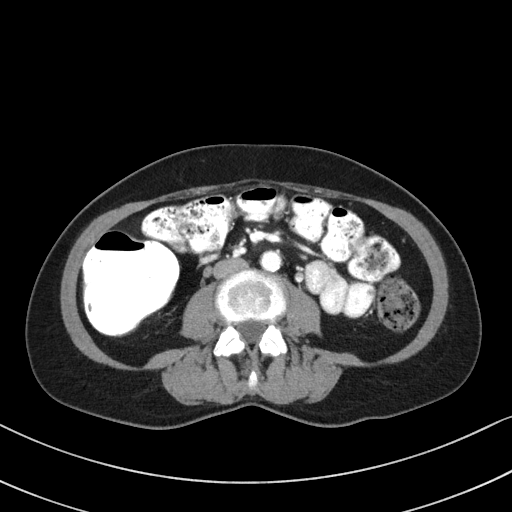
[im 56/93  soft-tissue]
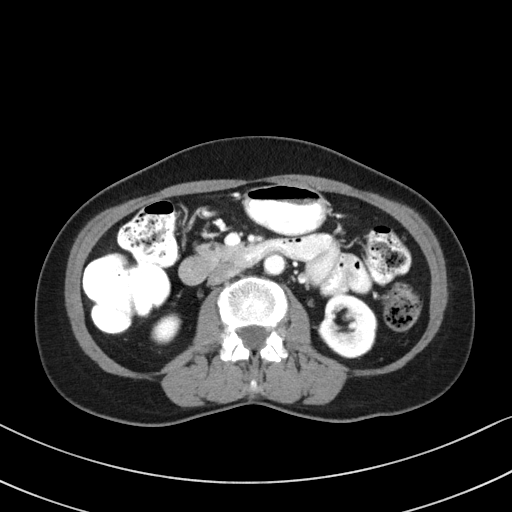
[im 62/93  soft-tissue]
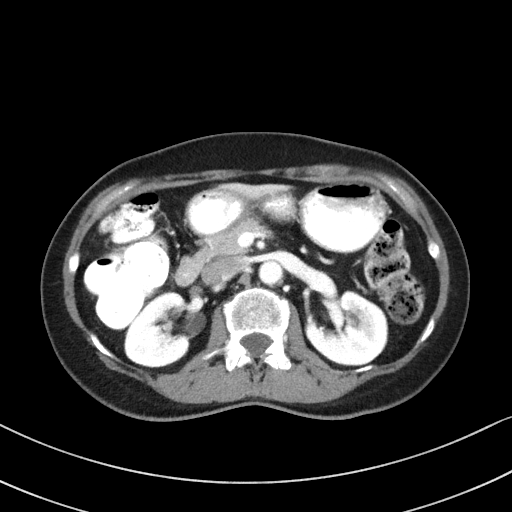
[im 62/93  bone]
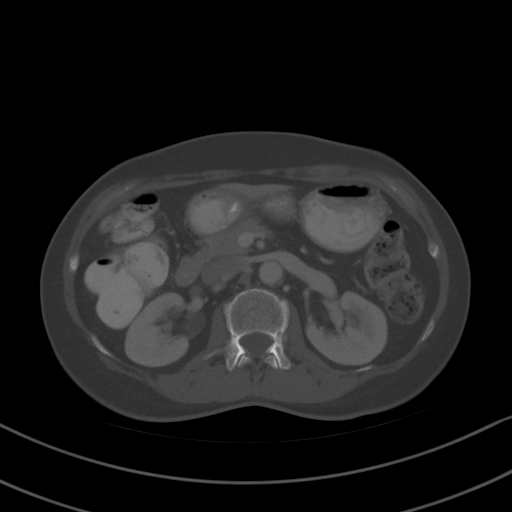
[im 68/93  soft-tissue]
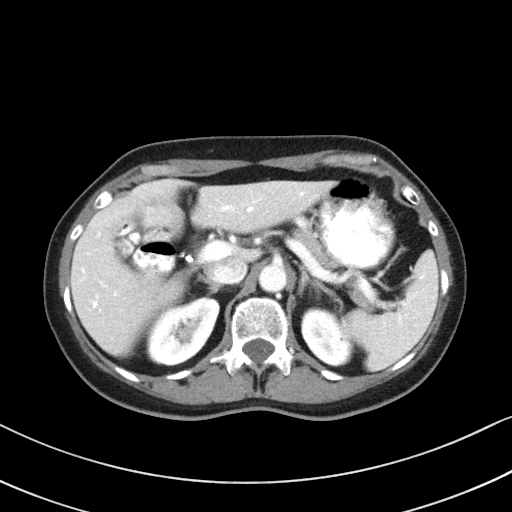
[im 74/93  soft-tissue]
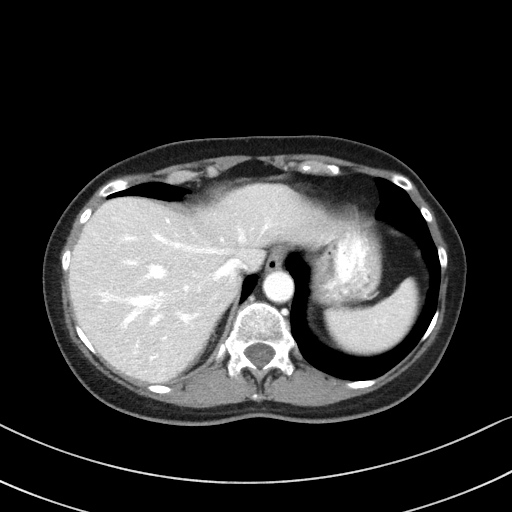
[im 80/93  soft-tissue]
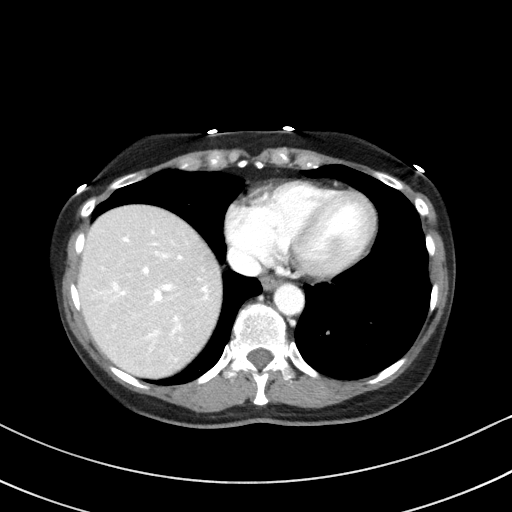
[im 86/93  soft-tissue]
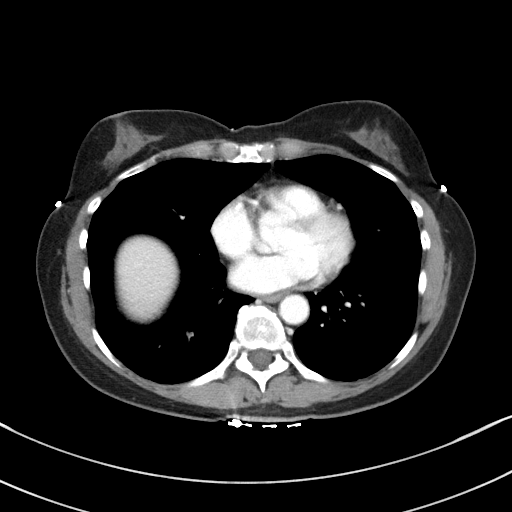

[Series 5: coronal st · coronal · 0.68mm/px · 3 of 73 slices shown]
[im 25/73  soft-tissue]
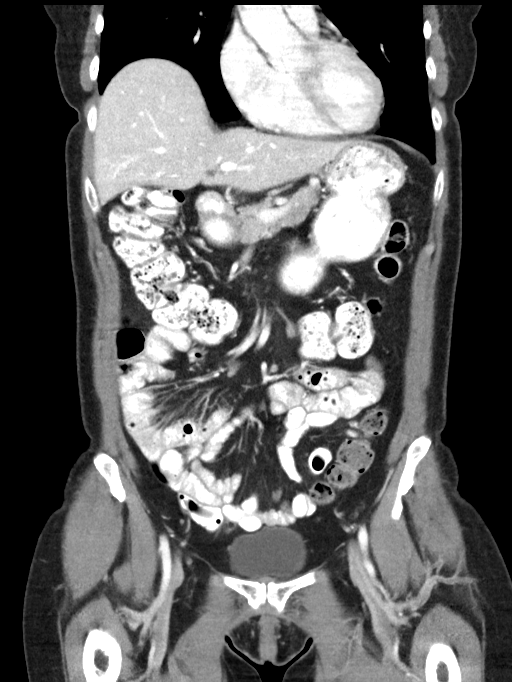
[im 33/73  soft-tissue]
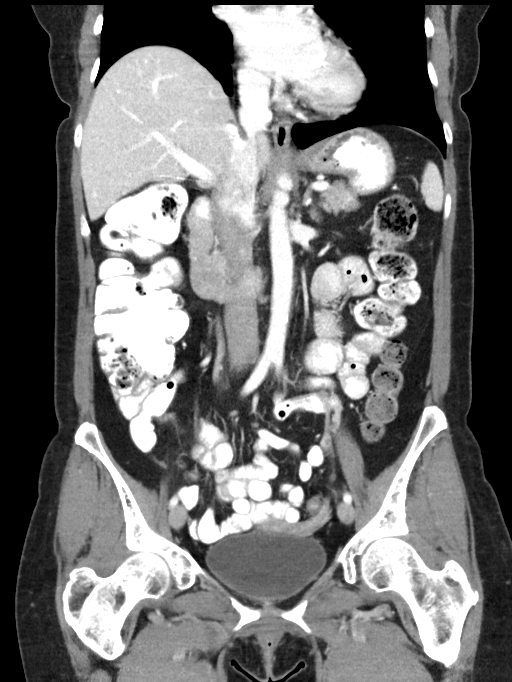
[im 41/73  soft-tissue]
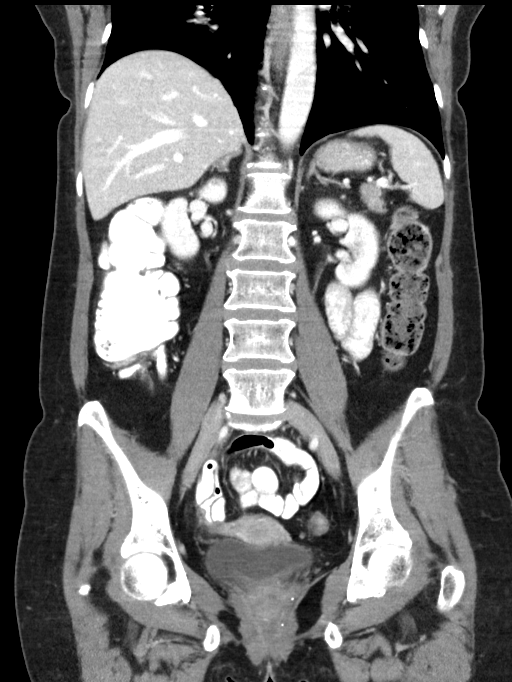

[16 of 46 positions shown; findings below may reference images not displayed]

FINDINGS: Lower Chest: No acute findings.

Hepatobiliary: No hepatic masses identified. Prior cholecystectomy.
No evidence of biliary obstruction.

Pancreas: Small less than 1 cm low-attenuation foci are seen in the
region of the uncinate process, suspicious for cystic pancreatic
lesions. No solid pancreatic mass identified. No evidence of
pancreatic ductal dilatation or peripancreatic inflammatory changes.

Spleen: Within normal limits in size and appearance.

Adrenals/Urinary Tract: No masses identified. No evidence of
ureteral calculi or hydronephrosis.

Stomach/Bowel: No evidence of obstruction, inflammatory process or
abnormal fluid collections.

Vascular/Lymphatic: No pathologically enlarged lymph nodes. No
abdominal aortic aneurysm.

Reproductive:  No mass or other significant abnormality.

Other: No evidence of ascites. No evidence of peritoneal or omental
soft tissue nodularity.

Musculoskeletal:  No suspicious bone lesions identified.
IMPRESSION: No evidence of metastatic disease within the abdomen or pelvis.

Two sub-cm low-attenuation foci in the region of the pancreatic
uncinate process, suspicious for cystic pancreatic lesions.
Recommend abdomen MRI without and with contrast for further
characterization.

## 2020-03-12 MED ORDER — IOHEXOL 300 MG/ML  SOLN
100.0000 mL | Freq: Once | INTRAMUSCULAR | Status: AC | PRN
  Administered 2020-03-12: 100 mL via INTRAVENOUS

## 2020-03-13 ENCOUNTER — Other Ambulatory Visit: Payer: Self-pay | Admitting: Otolaryngology

## 2020-03-13 DIAGNOSIS — E041 Nontoxic single thyroid nodule: Secondary | ICD-10-CM

## 2020-03-18 ENCOUNTER — Telehealth: Payer: Self-pay | Admitting: Gynecologic Oncology

## 2020-03-18 ENCOUNTER — Telehealth: Payer: Self-pay | Admitting: *Deleted

## 2020-03-18 DIAGNOSIS — Z1231 Encounter for screening mammogram for malignant neoplasm of breast: Secondary | ICD-10-CM

## 2020-03-18 DIAGNOSIS — K862 Cyst of pancreas: Secondary | ICD-10-CM

## 2020-03-18 DIAGNOSIS — R188 Other ascites: Secondary | ICD-10-CM

## 2020-03-18 NOTE — Telephone Encounter (Signed)
Patient called and stated "I need to talk with Dr Denman George or her nurse. I wanted to the results of the CT scan from last week. And on the AVS it said left ovarian cyst and malignant ascites. She never talked to me about the malignant ascites." Explained that the message will be giving to  [facility] APP and someone will call her back.

## 2020-03-18 NOTE — Telephone Encounter (Signed)
Returned call to patient. All questions answered and concerns addressed.  CT scan discussed. Advised of recommendations for MRI of the abdomen to further evaluate the pancreatic lesions. We will also place referrals to GI and for screening mammogram (last in [place]). Advised to call for any needs.

## 2020-03-19 ENCOUNTER — Telehealth: Payer: Self-pay | Admitting: *Deleted

## 2020-03-19 NOTE — Telephone Encounter (Signed)
Scheduled the patient for a MRI on 11/4 at 7 am with a 6:30 am arrive. Gave the patient the phone number to the breast center to schedule her mammogram and explained that [facility] will be calling her for an appt

## 2020-03-24 ENCOUNTER — Other Ambulatory Visit (HOSPITAL_COMMUNITY)
Admission: RE | Admit: 2020-03-24 | Discharge: 2020-03-24 | Disposition: A | Discharge status: Home / Self Care | Payer: 59 | Source: Ambulatory Visit | Attending: Radiology | Admitting: Radiology

## 2020-03-24 ENCOUNTER — Ambulatory Visit
Admission: RE | Admit: 2020-03-24 | Discharge: 2020-03-24 | Disposition: A | Discharge status: Home / Self Care | Payer: 59 | Source: Ambulatory Visit | Attending: Otolaryngology | Admitting: Otolaryngology

## 2020-03-24 DIAGNOSIS — E041 Nontoxic single thyroid nodule: Secondary | ICD-10-CM | POA: Insufficient documentation

## 2020-03-24 IMAGING — US US FNA BIOPSY THYROID 1ST LESION
1 series · 11 of 11 positions shown · non-contrast
Comparison: Thyroid ultrasound dated [DATE]

INDICATION: Patient with remote history of benign thyroid biopsy in [HOSPITAL] and
thyroid ultrasound on [DATE] which revealed a 1.9 cm left mid
thyroid nodule which meets criteria for biopsy. She presents today
for the procedure.

EXAM:
ULTRASOUND GUIDED FINE NEEDLE ASPIRATION BIOPSY OF LEFT MID THYROID
NODULE
TECHNIQUE: Informed written consent was obtained from the patient after a
discussion of the risks, benefits and alternatives to treatment.
Questions regarding the procedure were encouraged and answered. A
timeout was performed prior to the initiation of the procedure.

[Series 1: us fna biopsy thyroid 1st lesion · 0.04mm/px · 11 acquisitions, 11 frames shown]
[im 1/11]
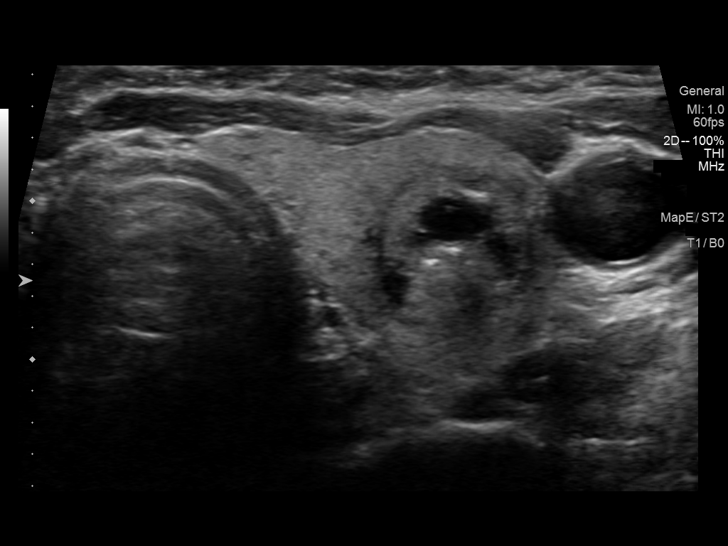
[im 2/11]
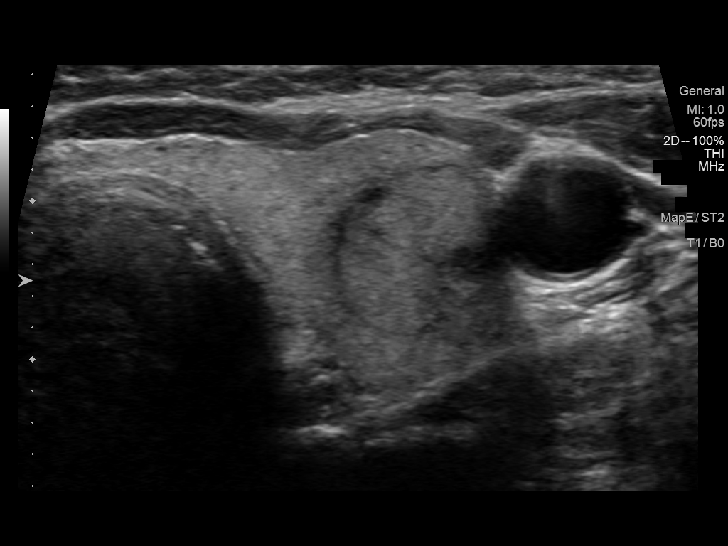
[im 3/11]
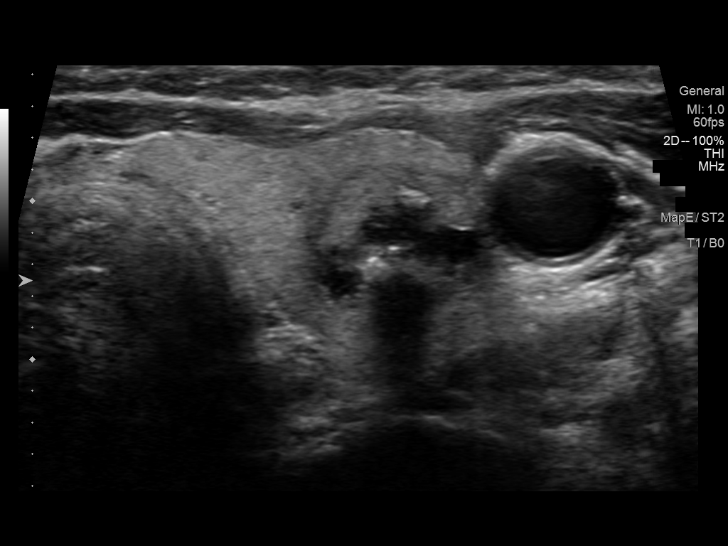
[im 4/11]
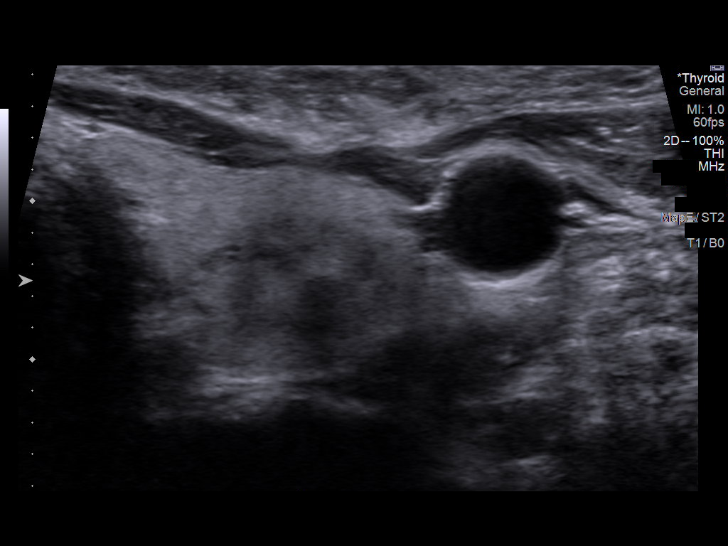
[im 5/11]
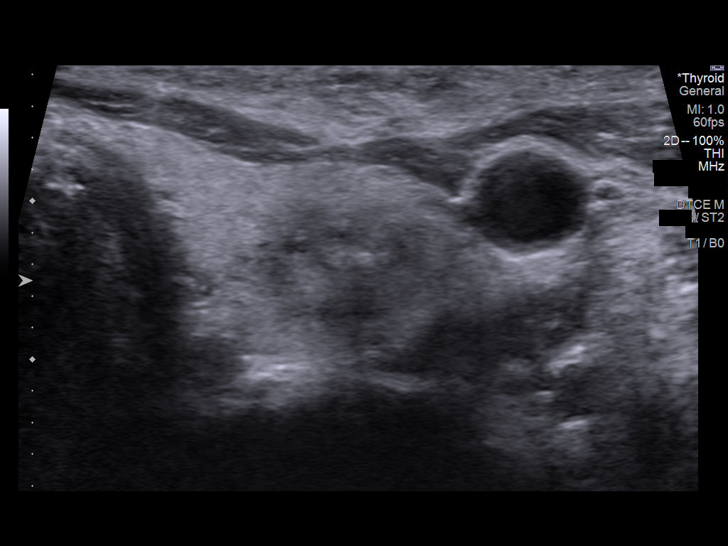
[im 6/11]
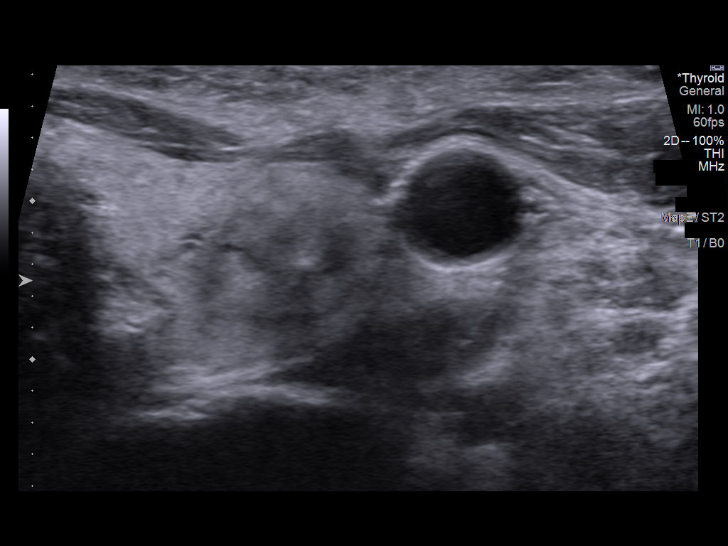
[im 7/11]
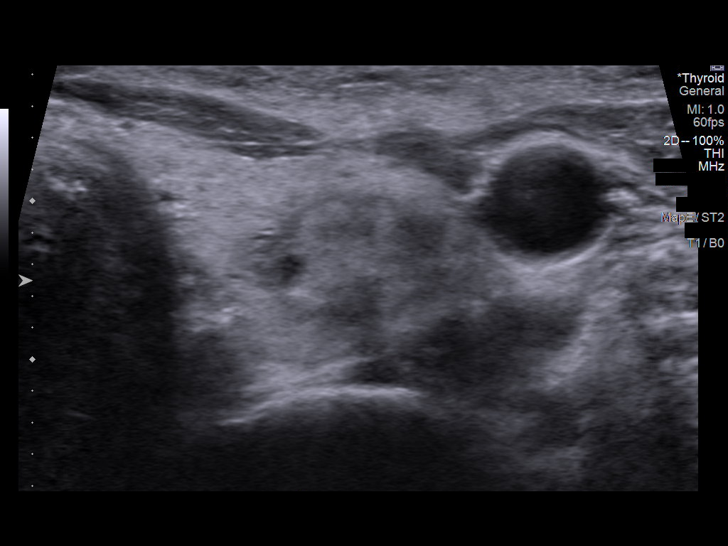
[im 8/11]
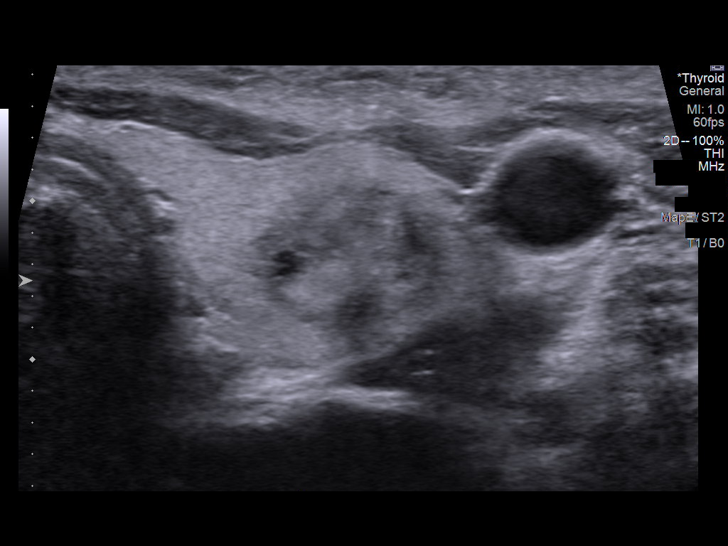
[im 9/11]
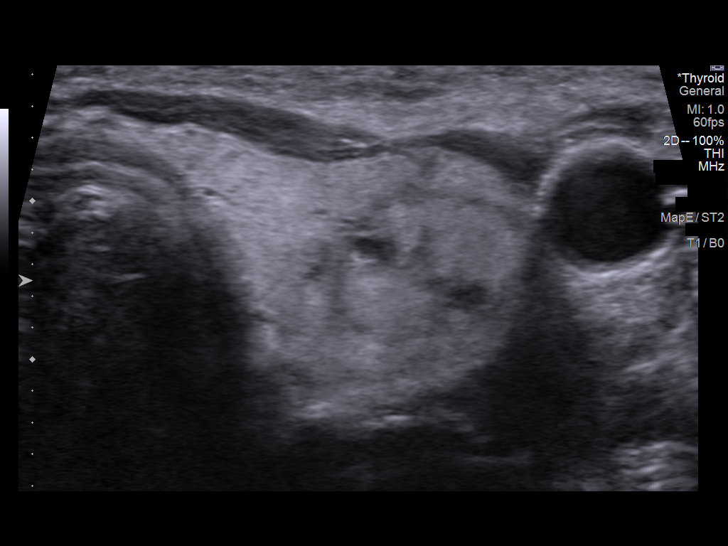
[im 10/11]
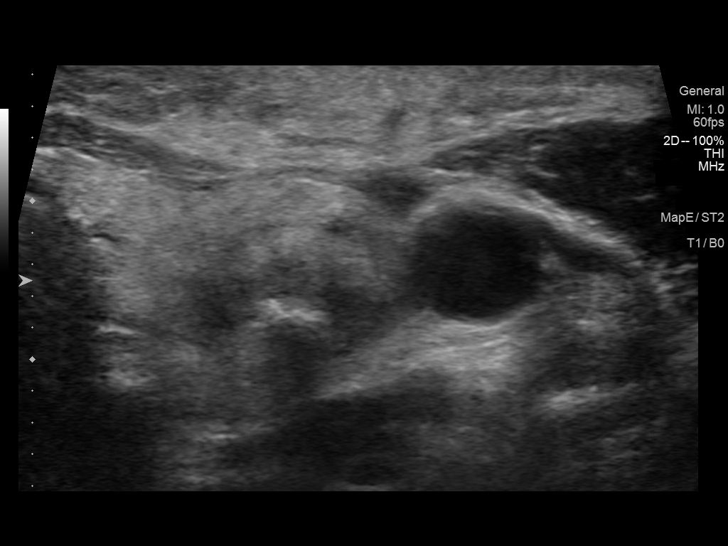
[im 11/11]
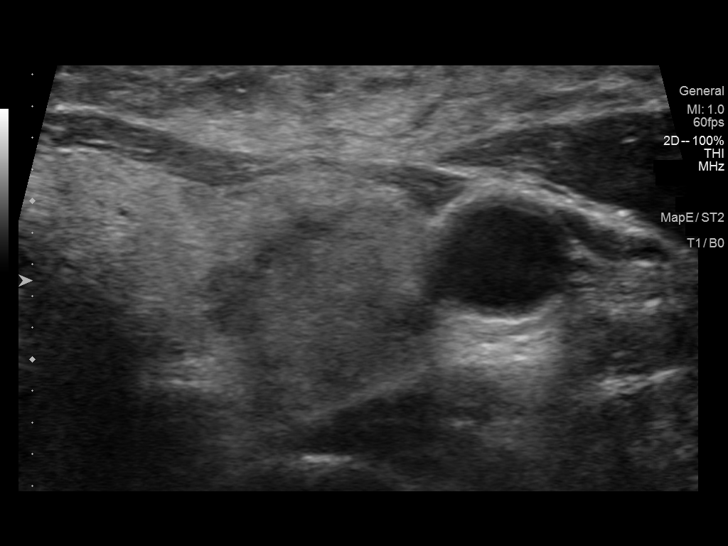

[11 of 11 positions shown; findings below may reference images not displayed]

MEDICATIONS:
1% lidocaine to skin and subcutaneous tissue

COMPLICATIONS:
SIR Level A - No therapy, no consequence.

The patient experienced a mild vasovagal response midway through
procedure which responded well to cool washcloth to the forehead and
Trendelenburg positioning. She was able to complete additional
biopsy sampling and prior to discharge home she was stable and with
no further symptoms.
Pre-procedural ultrasound scanning demonstrated unchanged size and
appearance of the indeterminate nodule within the left mid thyroid
lobe.

The procedure was planned. The neck was prepped in the usual sterile
fashion, and a sterile drape was applied covering the operative
field. A timeout was performed prior to the initiation of the
procedure. Local anesthesia was provided with 1% lidocaine.

Under direct ultrasound guidance, 6 FNA biopsies were performed of
the left mid thyroid nodule with 25 gauge needles. Multiple
ultrasound images were saved for procedural documentation purposes.
The samples were prepared and submitted to pathology as well as for
Afirma testing.

The patient did experience a mild vasovagal response midway through
procedure which responded well to cool washcloth to forehead and
Trendelenburg positioning however was able to complete the remainder
of the procedure. Limited post procedural scanning was negative for
hematoma or additional complication. Dressings were placed. The
patient otherwise tolerated the procedure well without immediate
postprocedural complication.
FINDINGS: Nodule reference number based on prior diagnostic ultrasound: 1

Maximum size: 1.9 cm

Location: Left; Mid

ACR TI-RADS risk category: TR4 (4-6 points)

Reason for biopsy: meets ACR TI-RADS criteria

Ultrasound imaging confirms appropriate placement of the needles
within the thyroid nodule.
IMPRESSION: Technically successful ultrasound guided fine needle aspiration
biopsy of left mid thyroid nodule. Final pathology pending.

As above, the patient experienced a mild vasovagal response midway
through procedure which responded well to cool washcloth to the
forehead and Trendelenburg positioning. She was able to complete
additional biopsy sampling and prior to discharge home she was
stable and with no further symptoms.

## 2020-03-25 LAB — CYTOLOGY - NON PAP

## 2020-03-26 ENCOUNTER — Other Ambulatory Visit: Payer: Self-pay | Admitting: Gynecologic Oncology

## 2020-03-26 ENCOUNTER — Other Ambulatory Visit: Payer: Self-pay

## 2020-03-26 ENCOUNTER — Ambulatory Visit (HOSPITAL_COMMUNITY)
Admission: RE | Admit: 2020-03-26 | Discharge: 2020-03-26 | Disposition: A | Discharge status: Home / Self Care | Payer: 59 | Source: Ambulatory Visit | Attending: Gynecologic Oncology | Admitting: Gynecologic Oncology

## 2020-03-26 DIAGNOSIS — K862 Cyst of pancreas: Secondary | ICD-10-CM | POA: Diagnosis not present

## 2020-03-26 IMAGING — MR MR 3D RECON AT SCANNER
20 of 23 series · 44 of 48 positions shown · IV contrast (gadavist)
Comparison: [DATE] CT abdomen/pelvis.

CLINICAL DATA: Suspected cystic pancreatic lesions on recent CT.
History of ovarian cancer status post right salpingo-oophorectomy
[DATE].

EXAM:
MRI ABDOMEN WITHOUT AND WITH CONTRAST
TECHNIQUE: Multiplanar multisequence MR imaging of the abdomen was performed
both before and after the administration of intravenous contrast.
CONTRAST:  5mL GADAVIST GADOBUTROL 1 MMOL/ML IV SOLN

[Series 3: T2 · coronal · 7.0mm · 1.37mm/px · 1 of 22 slices shown (1 of 2)]
[im 1/22]
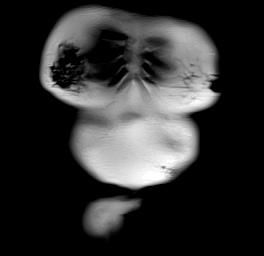

[Series 5: T2 fat-sat · axial · 6.0mm · 1.09mm/px · 1 of 34 slices shown]
[im 1/34]
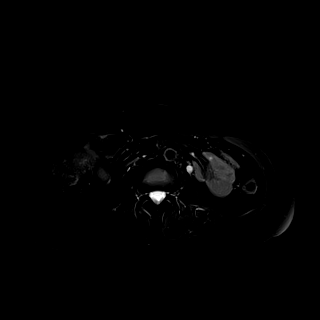

[Series 6: T1 · axial · 3.1mm · 1.07mm/px · z∈[-157,+88]mm · 3 of 80 slices shown (1 of 2)]
[im 1/80]
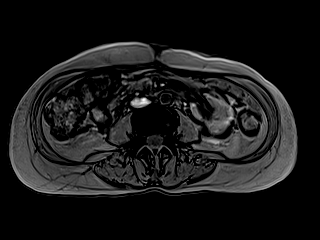
[im 40/80]
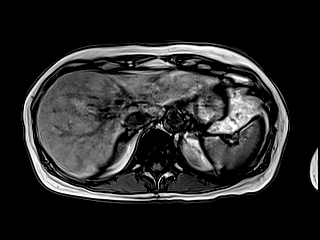
[im 80/80]
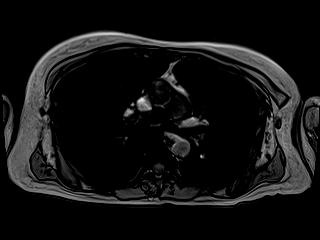

[Series 7: T1 · axial · 3.1mm · 1.07mm/px · z∈[-157,+88]mm · 3 of 80 slices shown (2 of 2)]
[im 1/80]
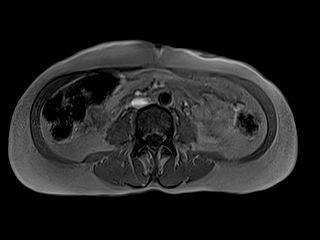
[im 40/80]
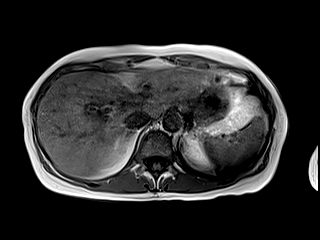
[im 80/80]
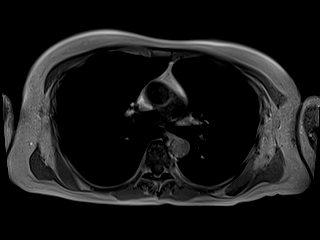

[Series 8: DWI · axial · 6.0mm · 1.36mm/px · z∈[-159,+93]mm · 3 of 72 slices shown (1 of 2)]
[im 1/72]
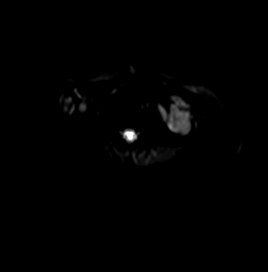
[im 36/72]
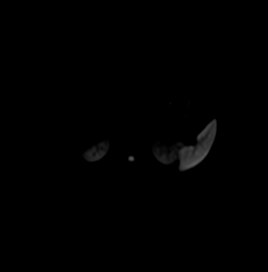
[im 72/72]
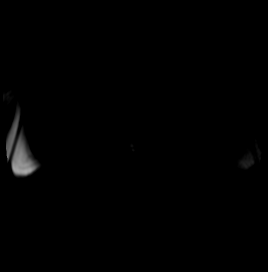

[Series 9: DWI · axial · 6.0mm · 1.36mm/px · 1 of 36 slices shown (2 of 2)]
[im 1/36]
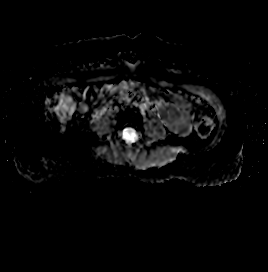

[Series 10: bSSFP · axial · 7.0mm · 1.09mm/px · 1 of 30 slices shown]
[im 1/30]
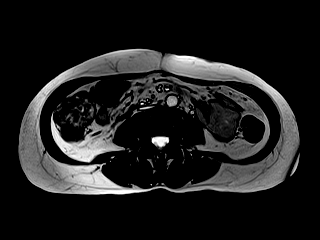

[Series 14: cor obl thk · sagittal · 50.0mm · 0.78mm/px · 1 of 9 slices shown]
[im 1/9]
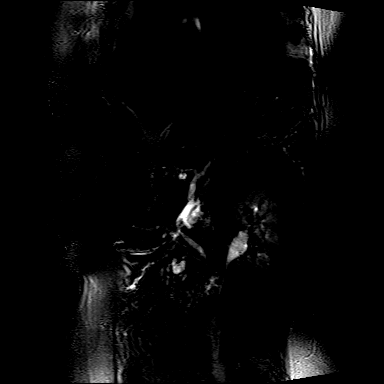

[Series 16: T1 dynamic · axial · 3.0mm · 1.09mm/px · z∈[-177,+84]mm · 3 of 88 slices shown (1 of 7)]
[im 1/88]
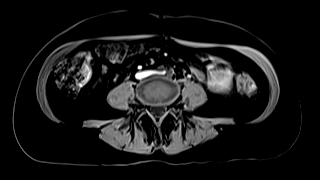
[im 44/88]
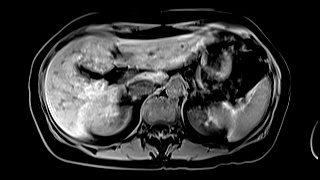
[im 88/88]
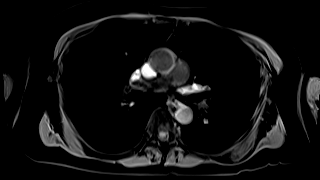

[Series 19: T1 dynamic · axial · 3.0mm · 1.09mm/px · z∈[-177,+84]mm · 3 of 88 slices shown (2 of 7)]
[im 1/88]
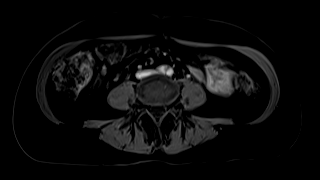
[im 44/88]
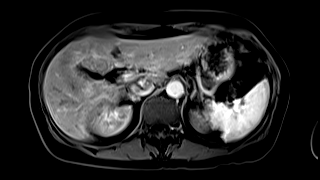
[im 88/88]
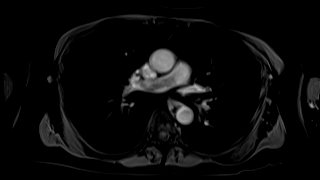

[Series 21: T1 dynamic · axial · 3.0mm · 1.09mm/px · z∈[-177,+84]mm · 3 of 88 slices shown (3 of 7)]
[im 1/88]
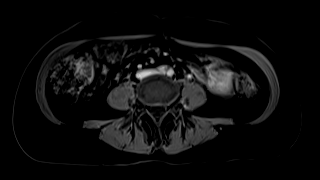
[im 44/88]
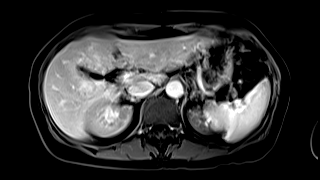
[im 88/88]
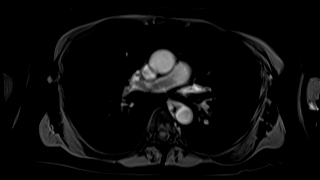

[Series 23: T1 dynamic · axial · 3.0mm · 1.09mm/px · z∈[-177,+84]mm · 3 of 88 slices shown (4 of 7)]
[im 1/88]
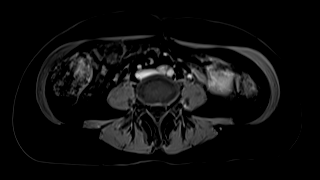
[im 44/88]
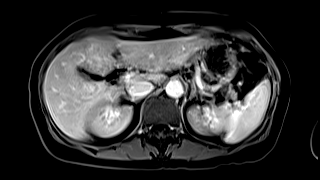
[im 88/88]
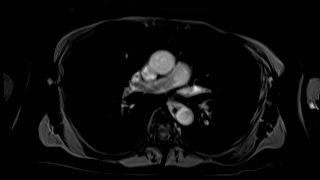

[Series 25: T1 dynamic · coronal · 5.0mm · 1.12mm/px · 1 of 36 slices shown (5 of 7)]
[im 1/36]
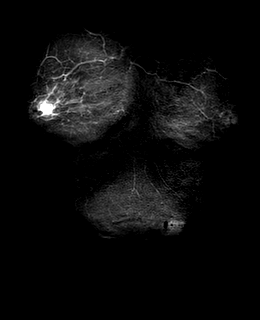

[Series 26: T2 · axial · 6.0mm · 1.36mm/px · 1 of 41 slices shown (2 of 2)]
[im 1/41]
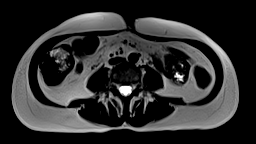

[Series 28: T1 dynamic · axial · 3.0mm · 1.09mm/px · z∈[-177,+84]mm · 3 of 88 slices shown (6 of 7)]
[im 1/88]
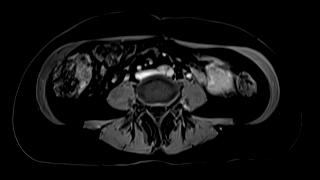
[im 44/88]
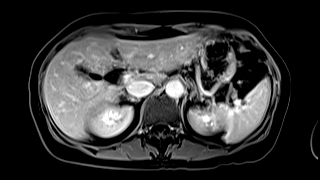
[im 88/88]
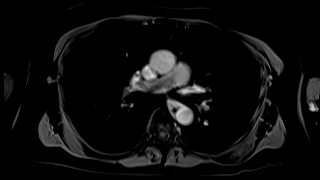

[Series 30: T1 dynamic · coronal · 5.0mm · 1.12mm/px · 1 of 36 slices shown (7 of 7)]
[im 1/36]
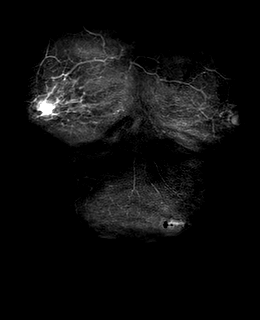

[Series 100: sub_20 sec · axial · 3.0mm · 1.09mm/px · z∈[-177,+84]mm · 3 of 88 slices shown]
[im 1/88]
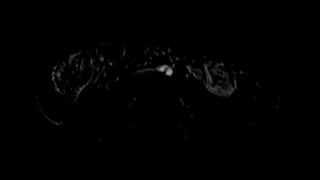
[im 44/88]
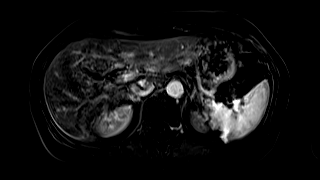
[im 88/88]
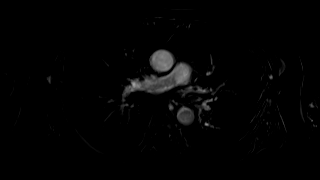

[Series 101: (id) sec · axial · 3.0mm · 1.09mm/px · z∈[-177,+84]mm · 3 of 88 slices shown]
[im 1/88]
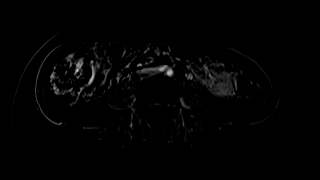
[im 44/88]
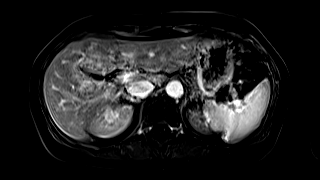
[im 88/88]
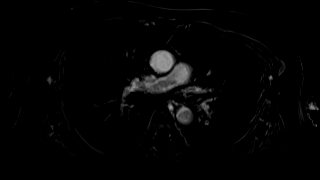

[Series 102: sub_90 sec · axial · 3.0mm · 1.09mm/px · z∈[-177,+84]mm · 3 of 88 slices shown]
[im 1/88]
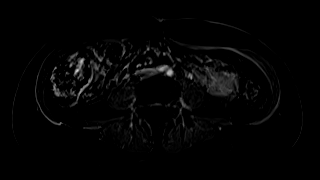
[im 44/88]
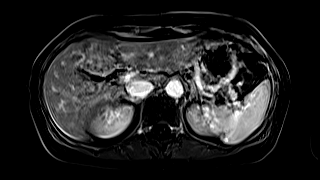
[im 88/88]
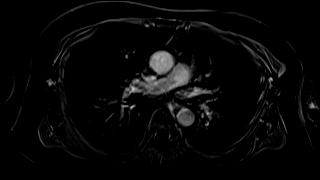

[Series 103: sub_delay · axial · 3.0mm · 1.09mm/px · z∈[-177,+84]mm · 3 of 88 slices shown]
[im 1/88]
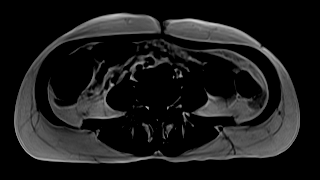
[im 44/88]
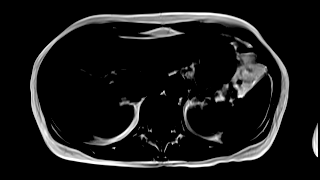
[im 88/88]
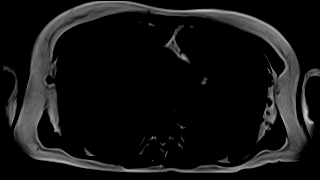

[44 of 48 positions shown; findings below may reference images not displayed]

FINDINGS: Lower chest: No acute abnormality at the lung bases. Stable mild
eventration of the right hemidiaphragm.

Hepatobiliary: Normal liver size and configuration. No hepatic
steatosis. No liver mass. Cholecystectomy. No biliary ductal
dilatation. Common bile duct diameter 4 mm. No choledocholithiasis.
No biliary masses, strictures or beading.

Pancreas: There is a 1.8 x 0.6 x 1.7 cm cystic anterior pancreatic
head mass (series 5/image 28) with lobulated outer contour and a few
thin internal septations, without wall thickening or solid
enhancement, which appears to communicate with the nondilated main
pancreatic duct, most compatible with a side branch IPMN. No
additional pancreatic mass. No pancreas divisum.

Spleen: Normal size. No mass.

Adrenals/Urinary Tract: Normal adrenals. No hydronephrosis. Normal
kidneys with no renal mass.

Stomach/Bowel: Normal non-distended stomach. Visualized small and
large bowel is normal caliber, with no bowel wall thickening.

Vascular/Lymphatic: Normal caliber abdominal aorta. Patent portal,
splenic, hepatic and renal veins. No pathologically enlarged lymph
nodes in the abdomen.

Other: No abdominal ascites or focal fluid collection.

Musculoskeletal: No aggressive appearing focal osseous lesions.
IMPRESSION: 1. Cystic 1.8 cm pancreatic head mass with MRI features most
compatible with a side branch IPMN. No high risk MRI findings.
Follow-up MRI abdomen without and with IV contrast recommended in 12
months. This recommendation follows ACR consensus guidelines:
Management of Incidental Pancreatic Cysts: A White Paper of the ACR
Incidental Findings Committee. [HOSPITAL] [IQ];[DATE].
2. No evidence of metastatic disease in the abdomen.
3. Cholecystectomy. No biliary ductal dilatation. No
choledocholithiasis.

## 2020-03-26 IMAGING — MR MR ABDOMEN WO/W CM
20 of 23 series · 44 of 48 positions shown · IV contrast (gadavist)
Comparison: [DATE] CT abdomen/pelvis.

CLINICAL DATA: Suspected cystic pancreatic lesions on recent CT.
History of ovarian cancer status post right salpingo-oophorectomy
[DATE].

EXAM:
MRI ABDOMEN WITHOUT AND WITH CONTRAST
TECHNIQUE: Multiplanar multisequence MR imaging of the abdomen was performed
both before and after the administration of intravenous contrast.
CONTRAST:  5mL GADAVIST GADOBUTROL 1 MMOL/ML IV SOLN

[Series 3: T2 · coronal · 7.0mm · 1.37mm/px · 1 of 22 slices shown (1 of 2)]
[im 1/22]
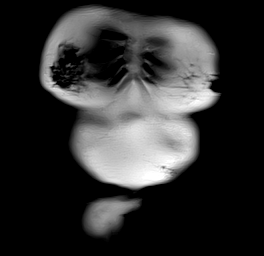

[Series 5: T2 fat-sat · axial · 6.0mm · 1.09mm/px · 1 of 34 slices shown]
[im 1/34]
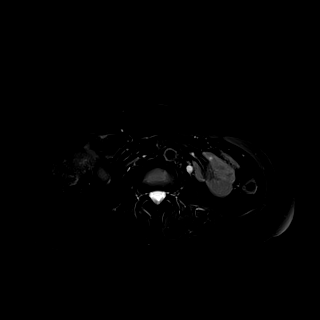

[Series 6: T1 · axial · 3.1mm · 1.07mm/px · z∈[-157,+88]mm · 3 of 80 slices shown (1 of 2)]
[im 1/80]
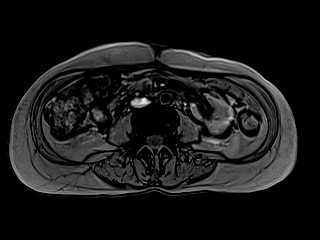
[im 40/80]
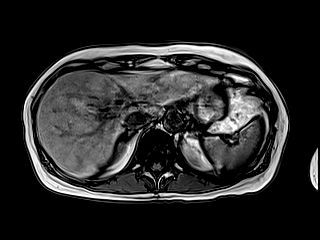
[im 80/80]
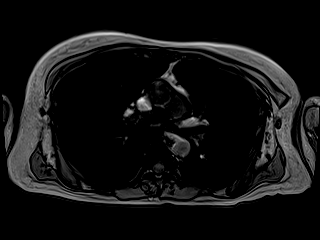

[Series 7: T1 · axial · 3.1mm · 1.07mm/px · z∈[-157,+88]mm · 3 of 80 slices shown (2 of 2)]
[im 1/80]
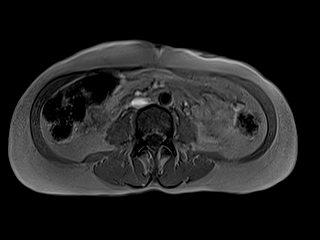
[im 40/80]
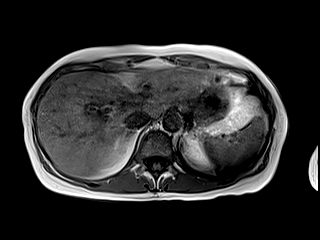
[im 80/80]
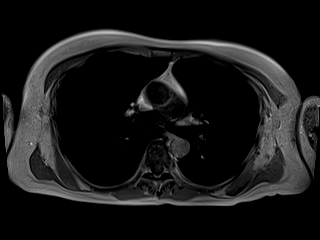

[Series 8: DWI · axial · 6.0mm · 1.36mm/px · z∈[-159,+93]mm · 3 of 72 slices shown (1 of 2)]
[im 1/72]
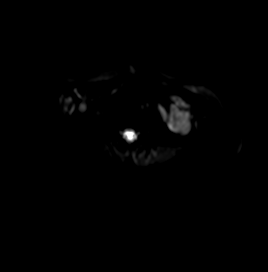
[im 36/72]
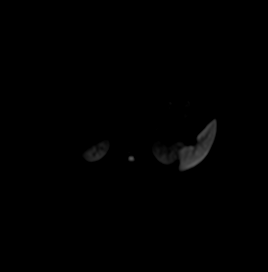
[im 72/72]
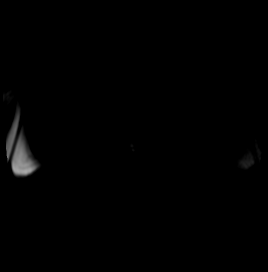

[Series 9: DWI · axial · 6.0mm · 1.36mm/px · 1 of 36 slices shown (2 of 2)]
[im 1/36]
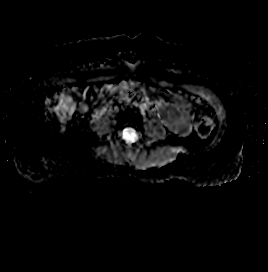

[Series 10: bSSFP · axial · 7.0mm · 1.09mm/px · 1 of 30 slices shown]
[im 1/30]
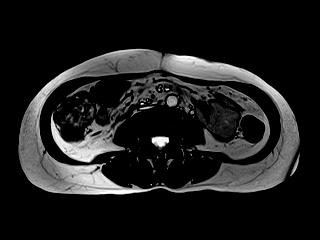

[Series 14: cor obl thk · sagittal · 50.0mm · 0.78mm/px · 1 of 9 slices shown]
[im 1/9]
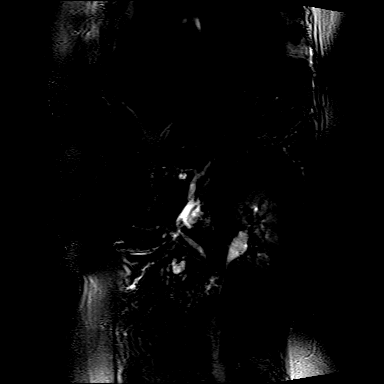

[Series 16: T1 dynamic · axial · 3.0mm · 1.09mm/px · z∈[-177,+84]mm · 3 of 88 slices shown (1 of 7)]
[im 1/88]
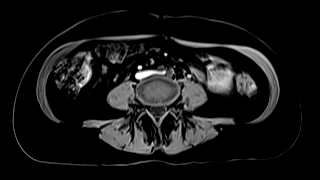
[im 44/88]
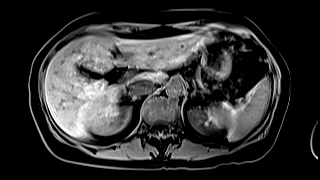
[im 88/88]
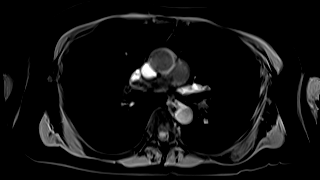

[Series 19: T1 dynamic · axial · 3.0mm · 1.09mm/px · z∈[-177,+84]mm · 3 of 88 slices shown (2 of 7)]
[im 1/88]
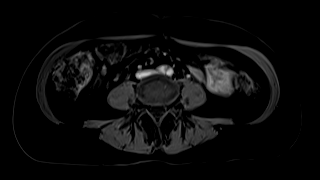
[im 44/88]
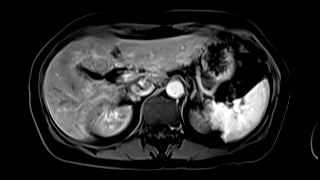
[im 88/88]
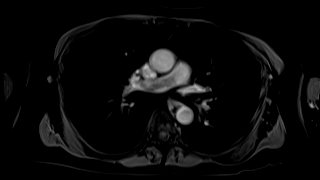

[Series 21: T1 dynamic · axial · 3.0mm · 1.09mm/px · z∈[-177,+84]mm · 3 of 88 slices shown (3 of 7)]
[im 1/88]
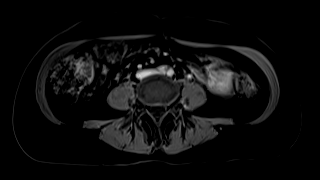
[im 44/88]
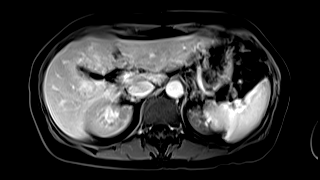
[im 88/88]
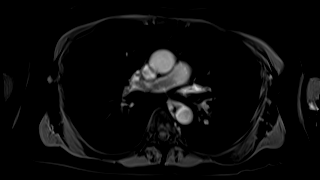

[Series 23: T1 dynamic · axial · 3.0mm · 1.09mm/px · z∈[-177,+84]mm · 3 of 88 slices shown (4 of 7)]
[im 1/88]
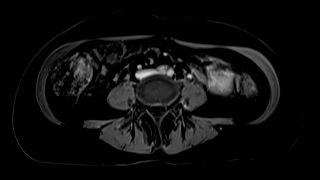
[im 44/88]
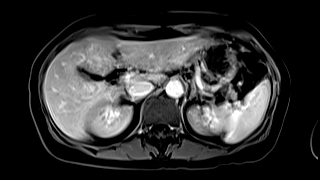
[im 88/88]
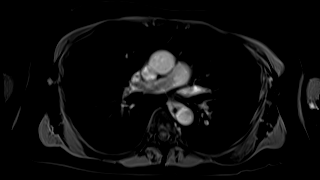

[Series 25: T1 dynamic · coronal · 5.0mm · 1.12mm/px · 1 of 36 slices shown (5 of 7)]
[im 1/36]
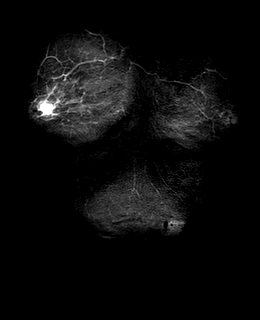

[Series 26: T2 · axial · 6.0mm · 1.36mm/px · 1 of 41 slices shown (2 of 2)]
[im 1/41]
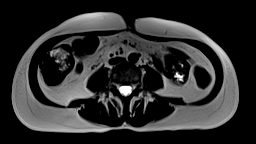

[Series 28: T1 dynamic · axial · 3.0mm · 1.09mm/px · z∈[-177,+84]mm · 3 of 88 slices shown (6 of 7)]
[im 1/88]
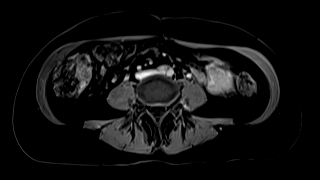
[im 44/88]
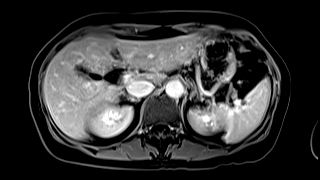
[im 88/88]
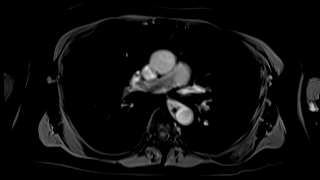

[Series 30: T1 dynamic · coronal · 5.0mm · 1.12mm/px · 1 of 36 slices shown (7 of 7)]
[im 1/36]
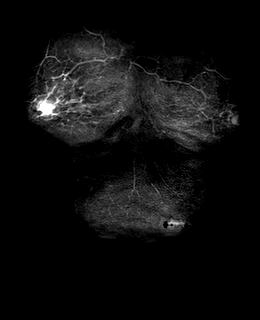

[Series 100: sub_20 sec · axial · 3.0mm · 1.09mm/px · z∈[-177,+84]mm · 3 of 88 slices shown]
[im 1/88]
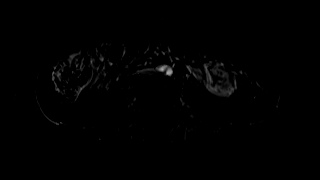
[im 44/88]
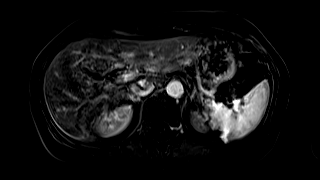
[im 88/88]
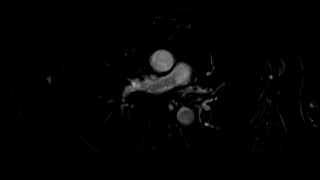

[Series 101: (id) sec · axial · 3.0mm · 1.09mm/px · z∈[-177,+84]mm · 3 of 88 slices shown]
[im 1/88]
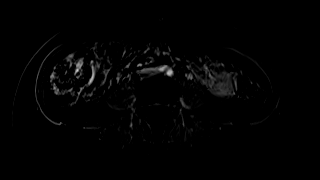
[im 44/88]
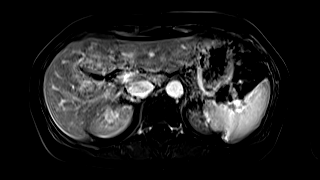
[im 88/88]
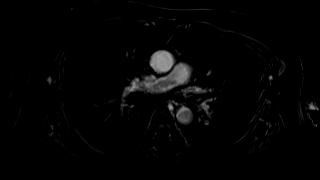

[Series 102: sub_90 sec · axial · 3.0mm · 1.09mm/px · z∈[-177,+84]mm · 3 of 88 slices shown]
[im 1/88]
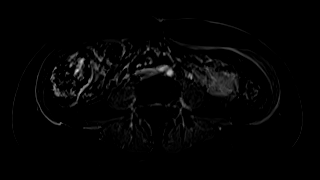
[im 44/88]
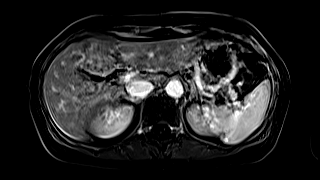
[im 88/88]
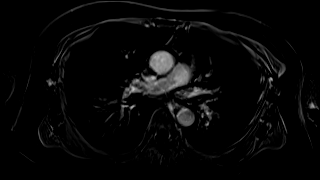

[Series 103: sub_delay · axial · 3.0mm · 1.09mm/px · z∈[-177,+84]mm · 3 of 88 slices shown]
[im 1/88]
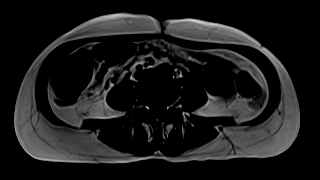
[im 44/88]
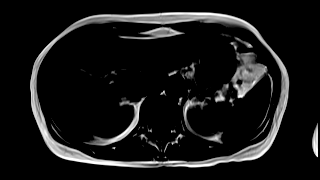
[im 88/88]
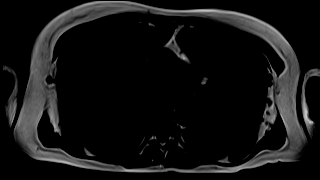

[44 of 48 positions shown; findings below may reference images not displayed]

FINDINGS: Lower chest: No acute abnormality at the lung bases. Stable mild
eventration of the right hemidiaphragm.

Hepatobiliary: Normal liver size and configuration. No hepatic
steatosis. No liver mass. Cholecystectomy. No biliary ductal
dilatation. Common bile duct diameter 4 mm. No choledocholithiasis.
No biliary masses, strictures or beading.

Pancreas: There is a 1.8 x 0.6 x 1.7 cm cystic anterior pancreatic
head mass (series 5/image 28) with lobulated outer contour and a few
thin internal septations, without wall thickening or solid
enhancement, which appears to communicate with the nondilated main
pancreatic duct, most compatible with a side branch IPMN. No
additional pancreatic mass. No pancreas divisum.

Spleen: Normal size. No mass.

Adrenals/Urinary Tract: Normal adrenals. No hydronephrosis. Normal
kidneys with no renal mass.

Stomach/Bowel: Normal non-distended stomach. Visualized small and
large bowel is normal caliber, with no bowel wall thickening.

Vascular/Lymphatic: Normal caliber abdominal aorta. Patent portal,
splenic, hepatic and renal veins. No pathologically enlarged lymph
nodes in the abdomen.

Other: No abdominal ascites or focal fluid collection.

Musculoskeletal: No aggressive appearing focal osseous lesions.
IMPRESSION: 1. Cystic 1.8 cm pancreatic head mass with MRI features most
compatible with a side branch IPMN. No high risk MRI findings.
Follow-up MRI abdomen without and with IV contrast recommended in 12
months. This recommendation follows ACR consensus guidelines:
Management of Incidental Pancreatic Cysts: A White Paper of the ACR
Incidental Findings Committee. [HOSPITAL] [IQ];[DATE].
2. No evidence of metastatic disease in the abdomen.
3. Cholecystectomy. No biliary ductal dilatation. No
choledocholithiasis.

## 2020-03-26 MED ORDER — GADOBUTROL 1 MMOL/ML IV SOLN
5.0000 mL | Freq: Once | INTRAVENOUS | Status: AC | PRN
  Administered 2020-03-26: 5 mL via INTRAVENOUS

## 2020-03-27 ENCOUNTER — Telehealth: Payer: Self-pay | Admitting: Oncology

## 2020-03-27 NOTE — Telephone Encounter (Signed)
Left a message regarding MRI results.  Requested a return call.

## 2020-03-27 NOTE — Telephone Encounter (Signed)
Called Chianne back with appointment on 04/10/20 at 10:00 with Truddie Coco, PA with [facility].  She verbalized understanding and agreement.

## 2020-03-27 NOTE — Telephone Encounter (Signed)
Casey Bauer called and was advised that she has what is felt to be a benign cyst of the pancreas.  There are no high risk features.  The recommendation is for a follow up MRI in 1 year and to also precede with the GI referral and a mammogram.  She verbalized agreement and said [facility] called her and they do not have an appointment until January.  She is wondering if there is any where else she can go.

## 2020-04-10 ENCOUNTER — Encounter: Payer: Self-pay | Admitting: Physician Assistant

## 2020-04-10 ENCOUNTER — Ambulatory Visit (INDEPENDENT_AMBULATORY_CARE_PROVIDER_SITE_OTHER): Payer: 59 | Admitting: Physician Assistant

## 2020-04-10 VITALS — BP 120/70 | HR 76 | Ht 61.0 in | Wt 105.0 lb

## 2020-04-10 DIAGNOSIS — Z1211 Encounter for screening for malignant neoplasm of colon: Secondary | ICD-10-CM

## 2020-04-10 DIAGNOSIS — R935 Abnormal findings on diagnostic imaging of other abdominal regions, including retroperitoneum: Secondary | ICD-10-CM | POA: Diagnosis not present

## 2020-04-10 MED ORDER — PLENVU 140 G PO SOLR: ORAL | 0 refills | Status: DC

## 2020-04-10 NOTE — Progress Notes (Signed)
Chief Complaint: Screening for colorectal cancer  HPI:    Casey Bauer is a 59 year old female, known to Dr. Henrene Pastor, with a past medical history of IBS and others listed below, who was referred to me by Linda Hedges, DO for a complaint of screening for colorectal cancer.     03/03/2020 office visit with gynecologic.  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  She had an office visit at that time and it was discussed that the atypical cells did not have a clear etiology.  Discussed that potential sources could be microscopic lesion in the left tube and ovary or uterus.  Alternatively could be peritoneal spread of atypical cells from another source.  Is recommended she get up-to-date with her colonoscopy and mammogram screening.    03/26/2020 MRI shows cystic 1.8 cm pancreatic head mass with MRI features most compatible sidebranch IPMN, no high risk MRI findings.  Follow-up MRI of the abdomen with and without IV contrast is recommended in 12 months.  No evidence of metastatic disease in the abdomen, cholecystectomy, no biliary ductal dilation or choledocholithiasis.    Today, the patient presents to clinic and tells me the only reason she is really here is because she was told to get up-to-date with her screening colonoscopy.  It has been 10 years since she had her last one in [place].  Does explain that they found some atypical cells as above and are not sure of the source, but just told her to get up-to-date on her screenings.  Describes chronic symptoms of self diagnosed IBS which she has had for 15 to 20 years telling me that about 20 to 30 minutes after eating she will have loose stool and sometimes has some abdominal cramping prior to this relieved with a bowel movement.  Sugar seems to be a trigger.    Denies fever, chills, blood in her stool, change in bowel habits, weight loss or symptoms that awaken her from sleep.       Past Medical History:  Diagnosis Date  . Complication of anesthesia    slow to wake up  . History of kidney stones   . IBS (irritable bowel syndrome)   . Osteopenia   . Osteoporosis   . STD (female)     Past Surgical History:  Procedure Laterality Date  . ARM SURGERY Right    fracture  . CHOLECYSTECTOMY N/A 10/12/2018   Procedure: LAPAROSCOPIC CHOLECYSTECTOMY;  Surgeon: Rolm Bookbinder, MD;  Location: [facility];  Service: General;  Laterality: N/A;  TAP BLOCK  . COLONOSCOPY    . ROBOTIC ASSISTED SALPINGO OOPHERECTOMY Right 02/11/2020   Procedure: XI ROBOTIC ASSISTED RIGHT SALPINGO OOPHORECTOMY;  Surgeon: Everitt Amber, MD;  Location: [facility];  Service: Gynecology;  Laterality: Right;  . UPPER GI ENDOSCOPY    . WISDOM TOOTH EXTRACTION      Current Outpatient Medications  Medication Sig Dispense Refill  . alendronate (FOSAMAX) 10 MG tablet Take 10 mg by mouth daily.    Marland Kitchen ibuprofen (ADVIL) 200 MG tablet Take 400 mg by mouth every 6 (six) hours as needed for headache or moderate pain.      No current facility-administered medications for this visit.    Allergies as of 04/10/2020  . (No Known Allergies)    Family History  Problem Relation Age of Onset  . Gallbladder disease Mother   . Cancer Mother        lung  .  COPD Father   . Asthma Father   . Heart failure Father   . Gallbladder disease Sister   . Bone cancer Paternal Grandmother   . Gallbladder disease Sister     Social History   Socioeconomic History  . Marital status: Married    Spouse name: Not on file  . Number of children: 1  . Years of education: Not on file  . Highest education level: Not on file  Occupational History  . Occupation: home maker  Tobacco Use  . Smoking status: Never Smoker  . Smokeless tobacco: Never Used  Vaping Use  . Vaping Use: Never used  Substance and Sexual Activity  . Alcohol use: Yes    Comment: occasional wine  . Drug use: Never  . Sexual activity: Not on file  Other  Topics Concern  . Not on file  Social History Narrative  . Not on file   Social Determinants of Health   Financial Resource Strain:   . Difficulty of Paying Living Expenses: Not on file  Food Insecurity:   . Worried About Charity fundraiser in the Last Year: Not on file  . Ran Out of Food in the Last Year: Not on file  Transportation Needs:   . Lack of Transportation (Medical): Not on file  . Lack of Transportation (Non-Medical): Not on file  Physical Activity:   . Days of Exercise per Week: Not on file  . Minutes of Exercise per Session: Not on file  Stress:   . Feeling of Stress : Not on file  Social Connections:   . Frequency of Communication with Friends and Family: Not on file  . Frequency of Social Gatherings with Friends and Family: Not on file  . Attends Religious Services: Not on file  . Active Member of Clubs or Organizations: Not on file  . Attends Archivist Meetings: Not on file  . Marital Status: Not on file  Intimate Partner Violence:   . Fear of Current or Ex-Partner: Not on file  . Emotionally Abused: Not on file  . Physically Abused: Not on file  . Sexually Abused: Not on file    Review of Systems:    Constitutional: No weight loss, fever or chills Skin: No rash  Cardiovascular: No chest pain Respiratory: No SOB Gastrointestinal: See HPI and otherwise negative   Physical Exam:  Vital signs: BP 120/70   Pulse 76   Ht 5\' 1"  (1.549 m)   Wt 105 lb (47.6 kg)   SpO2 98%   BMI 19.84 kg/m   Constitutional:   Pleasant Caucasian female appears to be in NAD, Well developed, Well nourished, alert and cooperative Respiratory: Respirations even and unlabored. Lungs clear to auscultation bilaterally.   No wheezes, crackles, or rhonchi.  Cardiovascular: Normal S1, S2. No MRG. Regular rate and rhythm. No peripheral edema, cyanosis or pallor.  Gastrointestinal:  Soft, nondistended, nontender. No rebound or guarding. Normal bowel sounds. No appreciable  masses or hepatomegaly. Rectal:  Not performed.  Psychiatric:  Demonstrates good judgement and reason without abnormal affect or behaviors.  MOST RECENT LABS AND IMAGING (and see HPI): CBC    Component Value Date/Time   WBC 6.5 02/10/2020 1051   RBC 4.48 02/10/2020 1051   HGB 14.1 02/10/2020 1051   HCT 42.1 02/10/2020 1051   PLT 269 02/10/2020 1051   MCV 94.0 02/10/2020 1051   MCH 31.5 02/10/2020 1051   MCHC 33.5 02/10/2020 1051   RDW 11.7 02/10/2020 1051  LYMPHSABS 1.4 10/12/2018 0746   MONOABS 0.3 10/12/2018 0746   EOSABS 0.1 10/12/2018 0746   BASOSABS 0.0 10/12/2018 0746    CMP     Component Value Date/Time   NA 139 02/10/2020 1051   K 3.9 02/10/2020 1051   CL 106 02/10/2020 1051   CO2 25 02/10/2020 1051   GLUCOSE 96 02/10/2020 1051   BUN 18 02/10/2020 1051   CREATININE 0.60 02/10/2020 1051   CALCIUM 8.7 (L) 02/10/2020 1051   PROT 6.7 02/10/2020 1051   ALBUMIN 4.3 02/10/2020 1051   AST 20 02/10/2020 1051   ALT 19 02/10/2020 1051   ALKPHOS 47 02/10/2020 1051   BILITOT 0.5 02/10/2020 1051   GFRNONAA >60 02/10/2020 1051   GFRAA >60 02/10/2020 1051    Assessment: 1.  Screening for colorectal cancer: Last colonoscopy 10 years ago, recent peritoneal washings from her OB GYN show some atypical cells and they want her to get up-to-date on colon cancer screening 2.  Abnormal MRI of the abdomen: Upon review of chart patient has what appears to be a cystic lesion in her pancreas, told to follow-up with repeat imaging in a year  Plan: 1.  Scheduled patient for screening colonoscopy in the [facility] with Dr. Henrene Pastor.  Did discuss risks, benefits, limitations and alternatives and patient agrees to proceed.  She has had her Covid vaccines. 2.  Patient should follow-up with her OB/GYN in repeating the MRI as above per their recommendations in a year. 3.  Patient to follow in clinic per recommendations from Dr. Henrene Pastor after time of procedure.  Ellouise Newer, PA-C [facility] 04/10/2020, 9:52 AM  Cc: Linda Hedges, DO

## 2020-04-10 NOTE — Progress Notes (Signed)
Noted  

## 2020-04-10 NOTE — Patient Instructions (Signed)
If you are age 59 or older, your body mass index should be between 23-30. Your Body mass index is 19.84 kg/m. If this is out of the aforementioned range listed, please consider follow up with your Primary Care Provider.  If you are age 82 or younger, your body mass index should be between 19-25. Your Body mass index is 19.84 kg/m. If this is out of the aformentioned range listed, please consider follow up with your Primary Care Provider.   You have been scheduled for a colonoscopy. Please follow written instructions given to you at your visit today.  Please pick up your prep supplies at the pharmacy within the next 1-3 days. If you use inhalers (even only as needed), please bring them with you on the day of your procedure.  Thank you for choosing me and [facility].  Ellouise Newer, PA-C

## 2020-04-21 ENCOUNTER — Ambulatory Visit (AMBULATORY_SURGERY_CENTER): Payer: 59 | Admitting: Internal Medicine

## 2020-04-21 ENCOUNTER — Other Ambulatory Visit: Payer: Self-pay

## 2020-04-21 ENCOUNTER — Encounter: Payer: Self-pay | Admitting: Internal Medicine

## 2020-04-21 VITALS — BP 102/53 | HR 88 | Temp 97.5°F | Resp 17 | Ht 61.0 in | Wt 105.0 lb

## 2020-04-21 DIAGNOSIS — Z1211 Encounter for screening for malignant neoplasm of colon: Secondary | ICD-10-CM

## 2020-04-21 MED ORDER — SODIUM CHLORIDE 0.9 % IV SOLN: 500.0000 mL | Freq: Once | INTRAVENOUS | Status: DC

## 2020-04-21 NOTE — Progress Notes (Signed)
Vital signs by [place]

## 2020-04-21 NOTE — Op Note (Signed)
Casey Bauer Patient Name: Casey Bauer Procedure Date: 04/21/2020 1:25 PM MRN: [id number] Endoscopist: Docia Chuck. Henrene Pastor , MD Age: 59 Referring MD:  Date of Birth: 04-01-1961 Gender: Female Account #: [id number] Procedure:                Colonoscopy Indications:              Screening for colorectal malignant neoplasm.                            Negative exam in [place] 10-11 years ago Medicines:                Monitored Anesthesia Care Procedure:                Pre-Anesthesia Assessment:                           - Prior to the procedure, a History and Physical                            was performed, and patient medications and                            allergies were reviewed. The patient's tolerance of                            previous anesthesia was also reviewed. The risks                            and benefits of the procedure and the sedation                            options and risks were discussed with the patient.                            All questions were answered, and informed consent                            was obtained. Prior Anticoagulants: The patient has                            taken no previous anticoagulant or antiplatelet                            agents. ASA Grade Assessment: I - A normal, healthy                            patient. After reviewing the risks and benefits,                            the patient was deemed in satisfactory condition to                            undergo the procedure.  After obtaining informed consent, the colonoscope                            was passed under direct vision. Throughout the                            procedure, the patient's blood pressure, pulse, and                            oxygen saturations were monitored continuously. The                            Colonoscope was introduced through the anus and                            advanced to the the cecum, identified by                             appendiceal orifice and ileocecal valve. The                            terminal ileum, ileocecal valve, appendiceal                            orifice, and rectum were photographed. The quality                            of the bowel preparation was excellent. The                            colonoscopy was performed without difficulty. The                            patient tolerated the procedure well. The bowel                            preparation used was Prepopik via split dose                            instruction. Scope In: 1:47:15 PM Scope Out: 1:58:22 PM Scope Withdrawal Time: 0 hours 9 minutes 3 seconds  Total Procedure Duration: 0 hours 11 minutes 7 seconds  Findings:                 The terminal ileum appeared normal.                           The entire examined colon appeared normal on direct                            and retroflexion views. Complications:            No immediate complications. Estimated blood loss:                            None. Estimated Blood  Loss:     Estimated blood loss: none. Impression:               - The examined portion of the ileum was normal.                           - The entire examined colon is normal on direct and                            retroflexion views.                           - No specimens collected. Recommendation:           - Repeat colonoscopy in 10 years for screening                            purposes.                           - Patient has a contact number available for                            emergencies. The signs and symptoms of potential                            delayed complications were discussed with the                            patient. Return to normal activities tomorrow.                            Written discharge instructions were provided to the                            patient.                           - Resume previous diet.                           -  Continue present medications. Docia Chuck. Henrene Pastor, MD 04/21/2020 2:05:21 PM This report has been signed electronically.

## 2020-04-21 NOTE — Progress Notes (Signed)
Report to PACU, RN, vss, BBS= Clear.  

## 2020-04-21 NOTE — Patient Instructions (Signed)
YOU HAD AN ENDOSCOPIC PROCEDURE TODAY AT THE [facility]:   Refer to the procedure report that was given to you for any specific questions about what was found during the examination.  If the procedure report does not answer your questions, please call your gastroenterologist to clarify.  If you requested that your care partner not be given the details of your procedure findings, then the procedure report has been included in a sealed envelope for you to review at your convenience later.  YOU SHOULD EXPECT: Some feelings of bloating in the abdomen. Passage of more gas than usual.  Walking can help get rid of the air that was put into your GI tract during the procedure and reduce the bloating. If you had a lower endoscopy (such as a colonoscopy or flexible sigmoidoscopy) you may notice spotting of blood in your stool or on the toilet paper. If you underwent a bowel prep for your procedure, you may not have a normal bowel movement for a few days.  Please Note:  You might notice some irritation and congestion in your nose or some drainage.  This is from the oxygen used during your procedure.  There is no need for concern and it should clear up in a day or so.  SYMPTOMS TO REPORT IMMEDIATELY:   Following lower endoscopy (colonoscopy or flexible sigmoidoscopy):  Excessive amounts of blood in the stool  Significant tenderness or worsening of abdominal pains  Swelling of the abdomen that is new, acute  Fever of 100F or higher  For urgent or emergent issues, a gastroenterologist can be reached at any hour by calling [phone]. Do not use MyChart messaging for urgent concerns.    DIET:  We do recommend a small meal at first, but then you may proceed to your regular diet.  Drink plenty of fluids but you should avoid alcoholic beverages for 24 hours.  ACTIVITY:  You should plan to take it easy for the rest of today and you should NOT DRIVE or use heavy machinery until tomorrow (because  of the sedation medicines used during the test).    FOLLOW UP: Our staff will call the number listed on your records 48-72 hours following your procedure to check on you and address any questions or concerns that you may have regarding the information given to you following your procedure. If we do not reach you, we will leave a message.  We will attempt to reach you two times.  During this call, we will ask if you have developed any symptoms of COVID 19. If you develop any symptoms (ie: fever, flu-like symptoms, shortness of breath, cough etc.) before then, please call [phone].  If you test positive for Covid 19 in the 2 weeks post procedure, please call and report this information to us.    If any biopsies were taken you will be contacted by phone or by letter within the next 1-3 weeks.  Please call us at [phone] if you have not heard about the biopsies in 3 weeks.    SIGNATURES/CONFIDENTIALITY: You and/or your care partner have signed paperwork which will be entered into your electronic medical record.  These signatures attest to the fact that that the information above on your After Visit Summary has been reviewed and is understood.  Full responsibility of the confidentiality of this discharge information lies with you and/or your care-partner. 

## 2020-04-23 ENCOUNTER — Telehealth: Payer: Self-pay

## 2020-04-23 NOTE — Telephone Encounter (Signed)
  Follow up Call-  Call back number 04/21/2020  Post procedure Call Back phone  # [phone]  Permission to leave phone message Yes  Some recent data might be hidden     Patient questions:  Do you have a fever, pain , or abdominal swelling? No. Pain Score  0 *  Have you tolerated food without any problems? Yes.    Have you been able to return to your normal activities? Yes.    Do you have any questions about your discharge instructions: Diet   No. Medications  No. Follow up visit  No.  Do you have questions or concerns about your Care? No.  Actions: * If pain score is 4 or above: No action needed, pain <4.  1. Have you developed a fever since your procedure? no  2.   Have you had an respiratory symptoms (SOB or cough) since your procedure? no  3.   Have you tested positive for COVID 19 since your procedure no  4.   Have you had any family members/close contacts diagnosed with the COVID 19 since your procedure?  no   If yes to any of these questions please route to Joylene John, RN and Joella Prince, RN

## 2020-05-08 DIAGNOSIS — Z1231 Encounter for screening mammogram for malignant neoplasm of breast: Secondary | ICD-10-CM

## 2020-05-25 ENCOUNTER — Encounter (HOSPITAL_BASED_OUTPATIENT_CLINIC_OR_DEPARTMENT_OTHER): Payer: 59

## 2020-05-27 ENCOUNTER — Other Ambulatory Visit: Payer: Self-pay | Admitting: Gynecologic Oncology

## 2020-05-27 DIAGNOSIS — Z1231 Encounter for screening mammogram for malignant neoplasm of breast: Secondary | ICD-10-CM

## 2020-05-28 ENCOUNTER — Ambulatory Visit (INDEPENDENT_AMBULATORY_CARE_PROVIDER_SITE_OTHER): Payer: 59

## 2020-05-28 ENCOUNTER — Other Ambulatory Visit: Payer: Self-pay

## 2020-05-28 DIAGNOSIS — Z1231 Encounter for screening mammogram for malignant neoplasm of breast: Secondary | ICD-10-CM | POA: Diagnosis not present

## 2020-05-28 IMAGING — MG DIGITAL SCREENING BILAT W/ TOMO W/ CAD
8 series · 9 of 24 positions shown · non-contrast
Comparison: None.

CLINICAL DATA: Screening.

EXAM:
DIGITAL SCREENING BILATERAL MAMMOGRAM WITH TOMO AND CAD

[L MLO synth-2D]
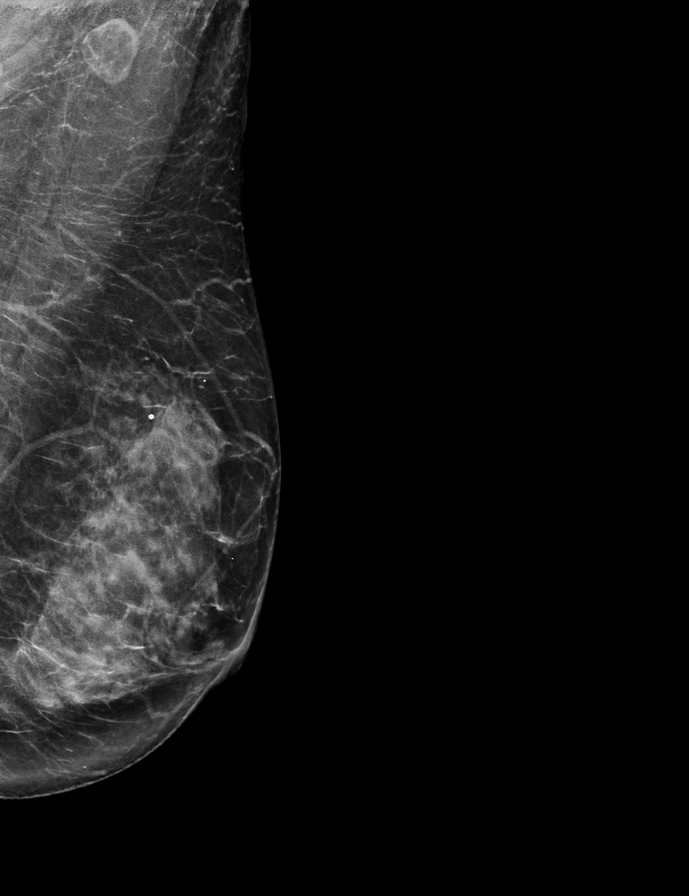

[R CC synth-2D]
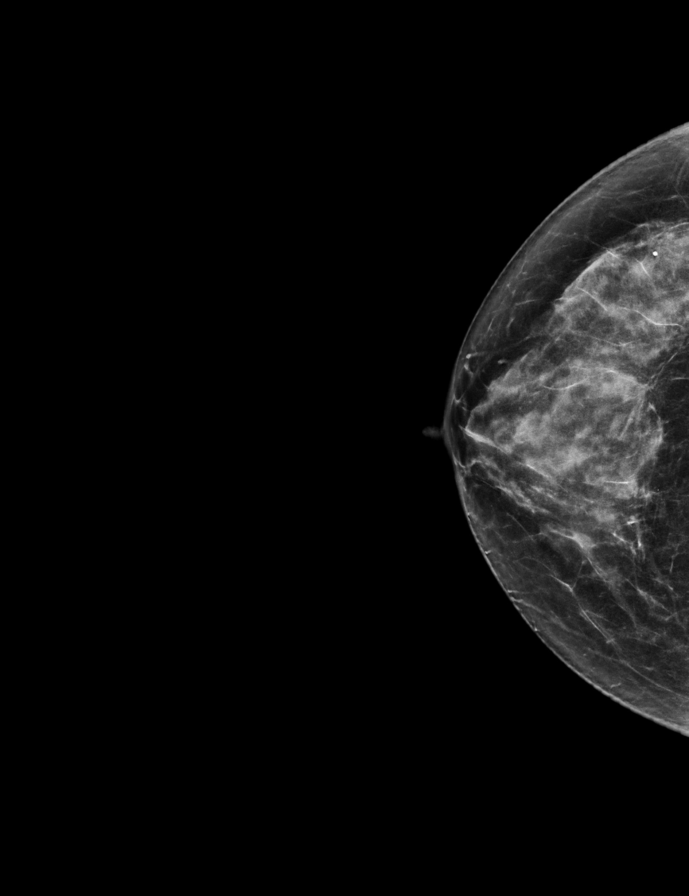

[R MLO synth-2D]
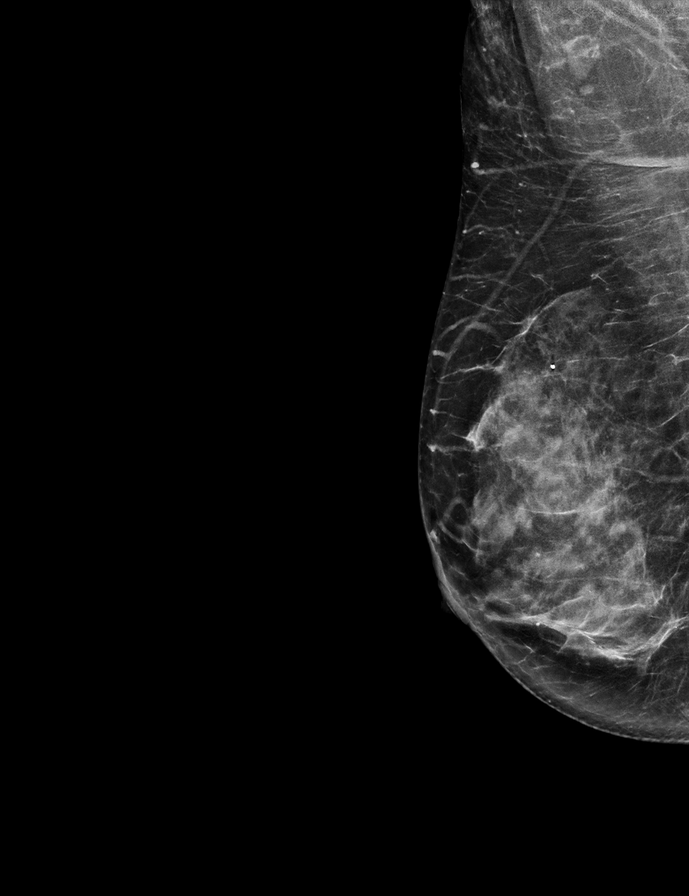

[L CC synth-2D]
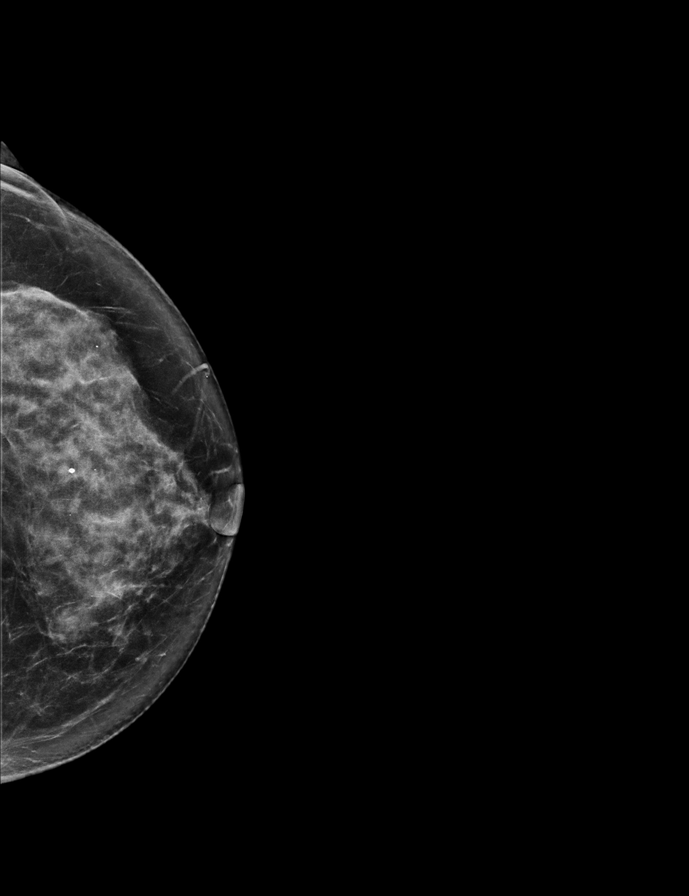

[R CC tomo · 2 of 58 frames shown]
[frame 19/58]
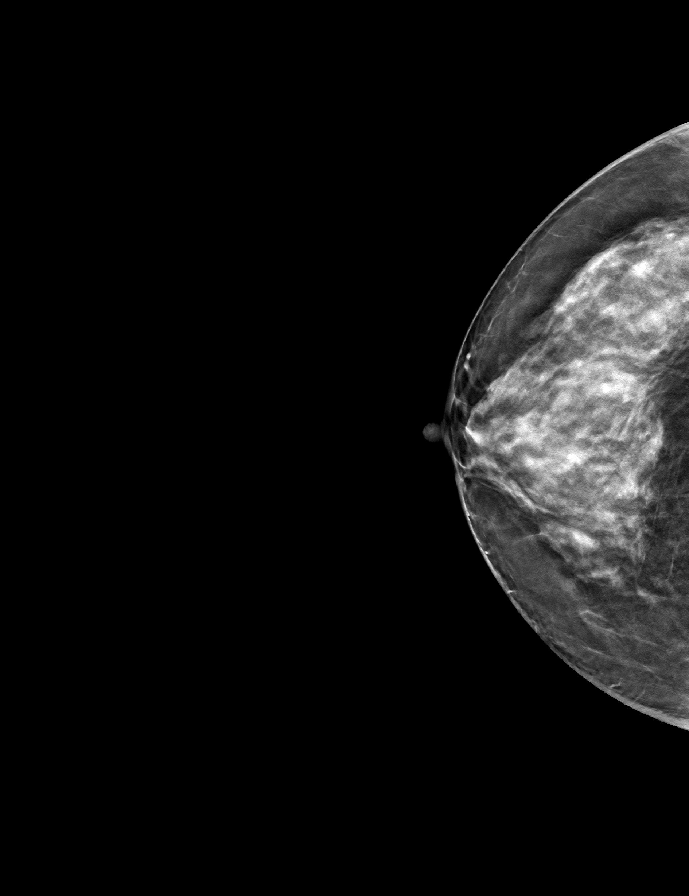
[frame 29/58]
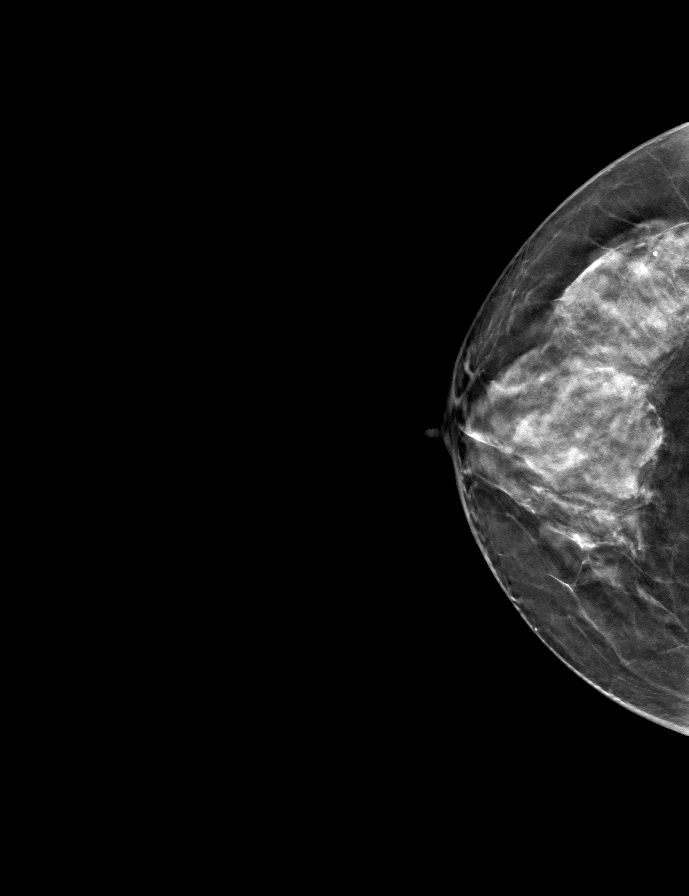

[L CC tomo · tomo slice 33/66.0]
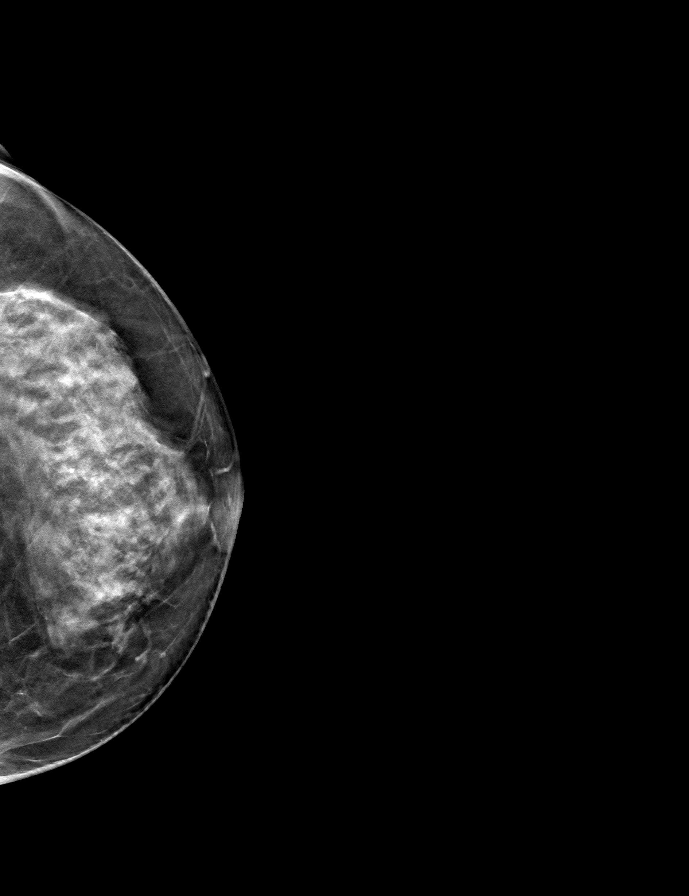

[R MLO tomo · tomo slice 30/59.0]
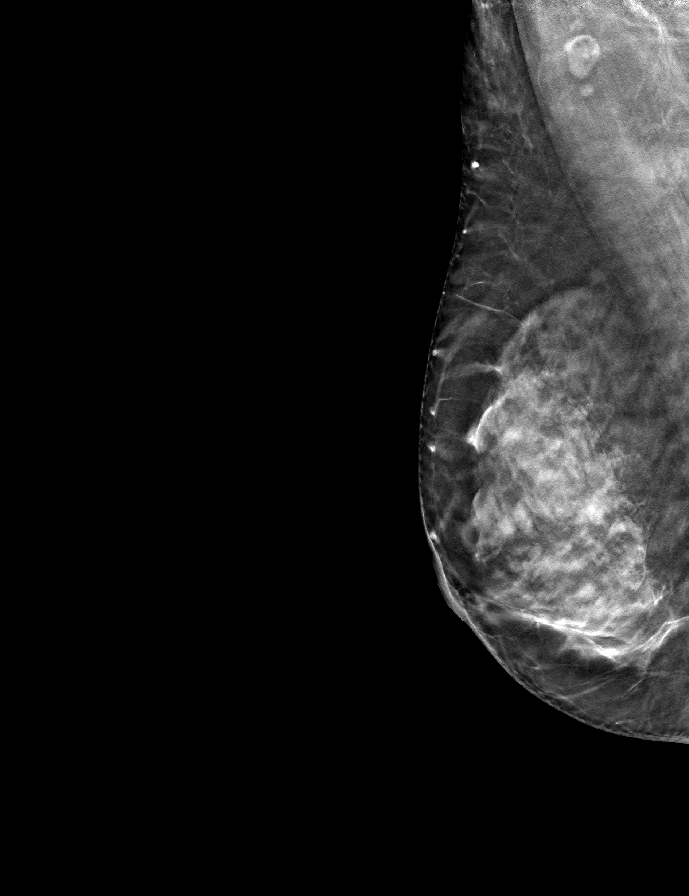

[L MLO tomo · tomo slice 31/61.0]
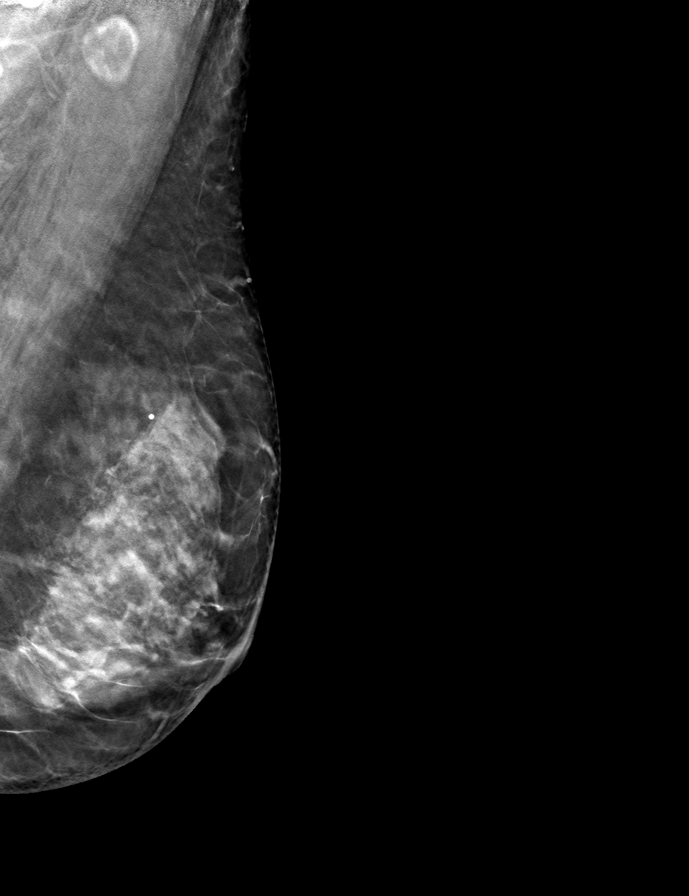

[9 of 24 positions shown; findings below may reference images not displayed]

ACR Breast Density Category c: The breast tissue is heterogeneously
dense, which may obscure small masses
FINDINGS: There are no findings suspicious for malignancy. Images were
processed with CAD.
IMPRESSION: No mammographic evidence of malignancy. A result letter of this
screening mammogram will be mailed directly to the patient.

RECOMMENDATION:
Screening mammogram in one year. (Code:[4W])

BI-RADS CATEGORY  1: Negative.

## 2021-02-24 ENCOUNTER — Telehealth: Payer: Self-pay | Admitting: *Deleted

## 2021-02-24 NOTE — Telephone Encounter (Signed)
Spoke with the patient and scheduled her to see Dr Berline Lopes on 10/14 at 3:15 pm

## 2021-03-04 NOTE — Progress Notes (Signed)
Gynecologic Oncology Return Clinic Visit  03/05/21  Reason for Visit: Treatment discussion  Treatment History: 02/10/21: Robotic-assisted laparoscopic right salpingo-oophorectomy. 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  03/12/20: CT A/P with no metastatic disease. Two sub-cm foci in pancreas, MRI recommended.  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  Interval History: Patient presents today to discuss additional treatment.  She underwent robotic USO last September with final pathology revealing benign ovarian mass but atypical cells seen with psammoma bodies and the peritoneal washings.  Given this, patient was offered completion surgery to rule out preinvasive or invasive lesion and other GYN organs versus close follow-up with ultrasound and CA-125 every 6 months for 5 years.  Subsequently, the patient had normal colonoscopy and mammogram.  CT scan after surgery showed no evidence of metastatic disease.  There were 2 small foci in the pancreas and MRI was recommended with findings as noted above.  Patient notes overall doing well since her last visit.  She denies any abdominal or pelvic pain.  She reports regular bowel and bladder function.  She denies any vaginal bleeding.  Colonoscopy 03/2020: normal no biopsies Last mammogram: 05/2020, BIRADS 1.  Past Medical/Surgical History: Past Medical History:  Diagnosis Date   Allergy    Anemia    Complication of anesthesia    slow to wake up   GERD (gastroesophageal reflux disease)    History of kidney stones    IBS (irritable bowel syndrome)    Osteopenia    Osteoporosis    STD (female)     Past Surgical History:  Procedure Laterality Date   ARM SURGERY Right    fracture   CHOLECYSTECTOMY N/A 10/12/2018   Procedure: LAPAROSCOPIC CHOLECYSTECTOMY;  Surgeon: Rolm Bookbinder, MD;   Location: [facility];  Service: General;  Laterality: N/A;  TAP BLOCK   COLONOSCOPY     ROBOTIC ASSISTED SALPINGO OOPHERECTOMY Right 02/11/2020   Procedure: XI ROBOTIC ASSISTED RIGHT SALPINGO OOPHORECTOMY;  Surgeon: Everitt Amber, MD;  Location: [facility];  Service: Gynecology;  Laterality: Right;   UPPER GI ENDOSCOPY     WISDOM TOOTH EXTRACTION      Family History  Problem Relation Age of Onset   Gallbladder disease Mother    Cancer Mother        lung   COPD Father    Asthma Father    Heart failure Father    Gallbladder disease Sister    Bone cancer Paternal Grandmother    Gallbladder disease Sister    Colon polyps Neg Hx    Colon cancer Neg Hx    Esophageal cancer Neg Hx    Stomach cancer Neg Hx     Social History   Socioeconomic History   Marital status: Married    Spouse name: Not on file   Number of children: 1   Years of education: Not on file   Highest education level: Not on file  Occupational History   Occupation: home maker  Tobacco Use   Smoking status: Never   Smokeless tobacco: Never  Vaping Use   Vaping Use: Never used  Substance and Sexual Activity   Alcohol use: Yes    Comment: occasional wine   Drug use: Never   Sexual activity: Not on file  Other Topics Concern   Not on file  Social History Narrative   Not on file   Social Determinants of Health  Financial Resource Strain: Not on file  Food Insecurity: Not on file  Transportation Needs: Not on file  Physical Activity: Not on file  Stress: Not on file  Social Connections: Not on file    Current Medications:  Current Outpatient Medications:    alendronate (FOSAMAX) 10 MG tablet, Take 10 mg by mouth daily., Disp: , Rfl:    Calcium Carbonate (CALCIUM 500 PO), Take by mouth., Disp: , Rfl:    ibuprofen (ADVIL) 200 MG tablet, Take 400 mg by mouth every 6 (six) hours as needed for headache or moderate pain. , Disp: , Rfl:   Review of Systems: Denies appetite changes, fevers, chills, fatigue,  unexplained weight changes. Denies hearing loss, neck lumps or masses, mouth sores, ringing in ears or voice changes. Denies cough or wheezing.  Denies shortness of breath. Denies chest pain or palpitations. Denies leg swelling. Denies abdominal distention, pain, blood in stools, constipation, diarrhea, nausea, vomiting, or early satiety. Denies pain with intercourse, dysuria, frequency, hematuria or incontinence. Denies hot flashes, pelvic pain, vaginal bleeding or vaginal discharge.   Denies joint pain, back pain or muscle pain/cramps. Denies itching, rash, or wounds. Denies dizziness, headaches, numbness or seizures. Denies swollen lymph nodes or glands, denies easy bruising or bleeding. Denies anxiety, depression, confusion, or decreased concentration.  Physical Exam: BP (!) 142/84 (BP Location: Left Arm, Patient Position: Sitting)   Pulse 81   Temp 98.5 F (36.9 C) (Oral)   Resp 16   Ht 5\' 1"  (1.549 m)   Wt 109 lb (49.4 kg)   SpO2 100%   BMI 20.60 kg/m  General: Alert, oriented, no acute distress. HEENT: Normocephalic, atraumatic, sclera anicteric. Chest: Clear to auscultation bilaterally.  No wheezes or rhonchi. Cardiovascular: Regular rate and rhythm, no murmurs. Abdomen: soft, nontender.  Normoactive bowel sounds.  No masses or hepatosplenomegaly appreciated.  Well-healed incisions, small keloid at infraumbilical incision. Extremities: Grossly normal range of motion.  Warm, well perfused.  No edema bilaterally. Skin: No rashes or lesions noted. Lymphatics: No cervical, supraclavicular, or inguinal adenopathy. GU: Normal appearing external genitalia without erythema, excoriation, or lesions.  Speculum exam reveals normal-appearing cervix, no vaginal lesions.  Bimanual exam reveals small mobile uterus, no adnexal masses appreciated.    Laboratory & Radiologic Studies: None new  Assessment & Plan: Casey Bauer is a 60 y.o. 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   Patient returns today for discussion of moving forward with definitive surgery in the setting of atypical cells seen and peritoneal washings at the start of her USO last year.  She has had colonoscopy and mammogram within the last year that have both been normal.  Given washings, I previously been counseled on possibility of a preinvasive or early invasive source from other GYN organs.  Discussed plan for robotic total hysterectomy and removal of remaining tube and ovary.  We will plan to get peritoneal washings again at the start of surgery and do a full pelvic and abdominal survey.  Unless there is something abnormal at the time of surgery, we will not plan to send anything for frozen section.  We discussed risks which include but are not limited to infection, bleeding, need for blood transfusion, damage to surrounding structures requiring repair, medical complications including pneumonia, DVT, stroke, cardiac event, or rarely death.  In terms of timing, discussed dates over the next couple of months, as the patient would like to have the surgery done before  the end of the year.  We opted to schedule the surgery right after Thanksgiving on November 29.  She will return for a preoperative visit closer to the date of surgery.   38 minutes of total time was spent for this patient encounter, including preparation, face-to-face counseling with the patient and coordination of care, and documentation of the encounter.  Jeral Pinch, MD  Division of Gynecologic Oncology  Department of Obstetrics and Gynecology  [facility]

## 2021-03-05 ENCOUNTER — Other Ambulatory Visit: Payer: Self-pay | Admitting: Gynecologic Oncology

## 2021-03-05 ENCOUNTER — Inpatient Hospital Stay: Payer: 59 | Attending: Gynecologic Oncology | Admitting: Gynecologic Oncology

## 2021-03-05 ENCOUNTER — Encounter: Payer: Self-pay | Admitting: Gynecologic Oncology

## 2021-03-05 ENCOUNTER — Other Ambulatory Visit: Payer: Self-pay

## 2021-03-05 VITALS — BP 142/84 | HR 81 | Temp 98.5°F | Resp 16 | Ht 61.0 in | Wt 109.0 lb

## 2021-03-05 DIAGNOSIS — R188 Other ascites: Secondary | ICD-10-CM

## 2021-03-05 DIAGNOSIS — R8569 Abnormal cytological findings in specimens from other digestive organs and abdominal cavity: Secondary | ICD-10-CM | POA: Diagnosis not present

## 2021-03-05 DIAGNOSIS — R859 Unspecified abnormal finding in specimens from digestive organs and abdominal cavity: Secondary | ICD-10-CM

## 2021-03-05 DIAGNOSIS — N83202 Unspecified ovarian cyst, left side: Secondary | ICD-10-CM

## 2021-03-05 DIAGNOSIS — D270 Benign neoplasm of right ovary: Secondary | ICD-10-CM | POA: Diagnosis not present

## 2021-03-05 DIAGNOSIS — Z90721 Acquired absence of ovaries, unilateral: Secondary | ICD-10-CM | POA: Diagnosis not present

## 2021-03-05 DIAGNOSIS — K862 Cyst of pancreas: Secondary | ICD-10-CM

## 2021-03-05 NOTE — Patient Instructions (Signed)
We will proceed with scheduling your surgery on April 20, 2021 with Dr. Jeral Pinch at [facility]. We will see you for a preop appointment with Joylene John, NP before surgery to discuss the procedure in detail.   Before this visit, you may get a phone call from the hospital as well to set you up for a pre-surgical testing appointment before surgery.

## 2021-03-17 ENCOUNTER — Telehealth: Payer: Self-pay | Admitting: *Deleted

## 2021-03-17 NOTE — Telephone Encounter (Signed)
MRI approved but patient needs to call [phone] & reference case# [id number] to okay the facility. This has to be done based on the type of plan she has.   Called and gave the patient the above information

## 2021-04-06 ENCOUNTER — Other Ambulatory Visit: Payer: Self-pay | Admitting: Gynecologic Oncology

## 2021-04-06 ENCOUNTER — Ambulatory Visit (HOSPITAL_COMMUNITY): Payer: 59

## 2021-04-06 ENCOUNTER — Other Ambulatory Visit: Payer: Self-pay

## 2021-04-06 ENCOUNTER — Inpatient Hospital Stay: Payer: 59 | Attending: Gynecologic Oncology | Admitting: Gynecologic Oncology

## 2021-04-06 ENCOUNTER — Ambulatory Visit (HOSPITAL_COMMUNITY)
Admission: RE | Admit: 2021-04-06 | Discharge: 2021-04-06 | Disposition: A | Discharge status: Home / Self Care | Payer: 59 | Source: Ambulatory Visit | Attending: Gynecologic Oncology | Admitting: Gynecologic Oncology

## 2021-04-06 VITALS — BP 152/77 | HR 76 | Temp 98.2°F | Ht 61.0 in | Wt 107.1 lb

## 2021-04-06 DIAGNOSIS — K862 Cyst of pancreas: Secondary | ICD-10-CM | POA: Diagnosis not present

## 2021-04-06 DIAGNOSIS — R859 Unspecified abnormal finding in specimens from digestive organs and abdominal cavity: Secondary | ICD-10-CM

## 2021-04-06 IMAGING — MR MR ABDOMEN WO/W CM
19 of 20 series · 46 of 48 positions shown · IV contrast (5ml GADAVIST)
Comparison: MRI [DATE]

CLINICAL DATA: Follow-up cystic pancreatic lesion.

EXAM:
MRI ABDOMEN WITHOUT AND WITH CONTRAST
TECHNIQUE: Multiplanar multisequence MR imaging of the abdomen was performed
both before and after the administration of intravenous contrast.
CONTRAST:  5mL GADAVIST GADOBUTROL 1 MMOL/ML IV SOLN

[Series 2: DWI · axial · 6.0mm · 1.36mm/px · z∈[-52,+207]mm · 4 of 74 slices shown (1 of 2)]
[im 1/74]
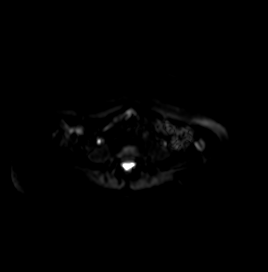
[im 25/74]
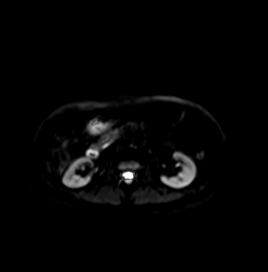
[im 49/74]
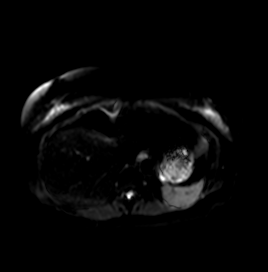
[im 74/74]
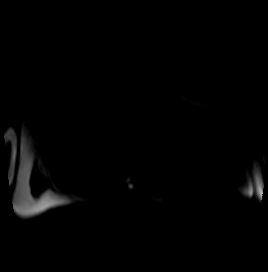

[Series 3: DWI · axial · 6.0mm · 1.36mm/px · z∈[-52,+207]mm · 2 of 37 slices shown (2 of 2)]
[im 1/37]
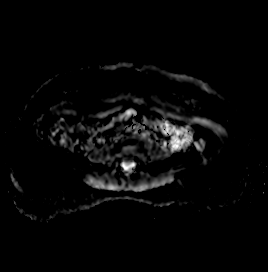
[im 37/37]
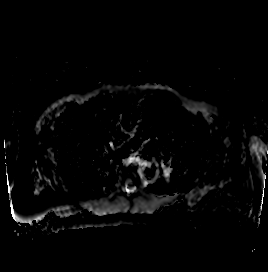

[Series 5: T2 fat-sat · axial · 6.0mm · 1.09mm/px · 1 of 36 slices shown]
[im 1/36]
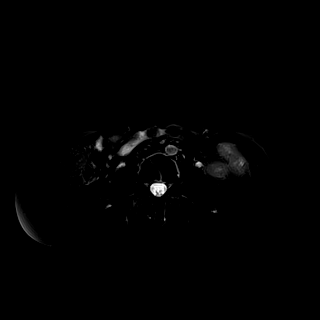

[Series 7: T2 · coronal · 7.0mm · 1.56mm/px · 1 of 30 slices shown (1 of 2)]
[im 1/30]
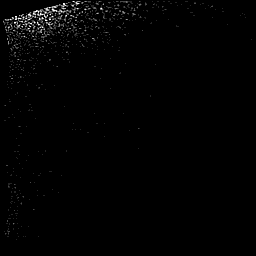

[Series 8: T1 · axial · 3.1mm · 1.09mm/px · z∈[-32,+213]mm · 3 of 80 slices shown (1 of 2)]
[im 1/80]
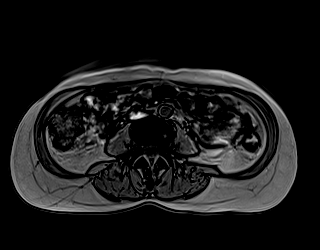
[im 40/80]
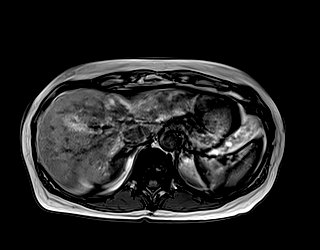
[im 80/80]
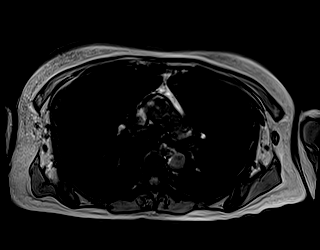

[Series 9: T1 · axial · 3.1mm · 1.09mm/px · z∈[-32,+213]mm · 3 of 80 slices shown (2 of 2)]
[im 1/80]
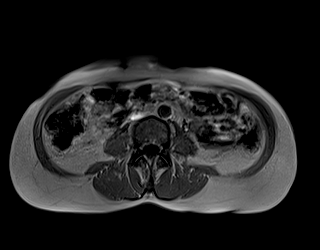
[im 40/80]
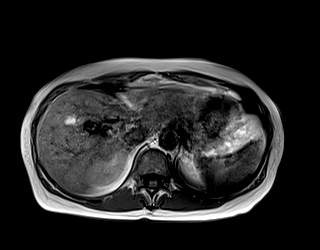
[im 80/80]
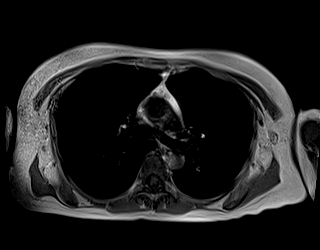

[Series 10: bSSFP · axial · 7.0mm · 1.12mm/px · 1 of 32 slices shown]
[im 1/32]
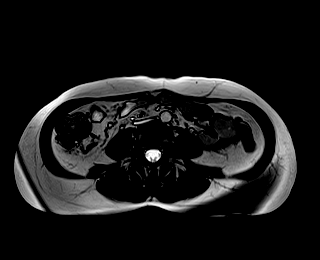

[Series 13: T1 dynamic · axial · 3.0mm · 1.09mm/px · z∈[-41,+220]mm · 3 of 88 slices shown (1 of 10)]
[im 1/88]
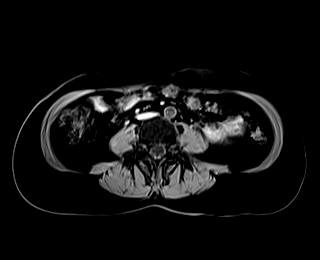
[im 44/88]
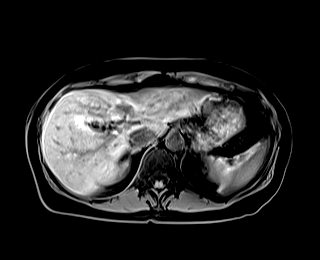
[im 88/88]
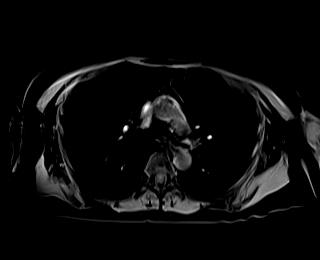

[Series 17: T1 dynamic · axial · 3.0mm · 1.09mm/px · z∈[-41,+220]mm · 3 of 88 slices shown (2 of 10)]
[im 1/88]
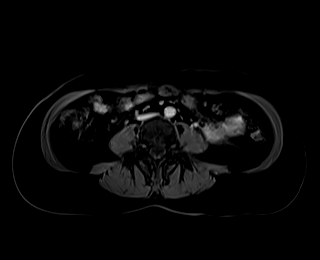
[im 44/88]
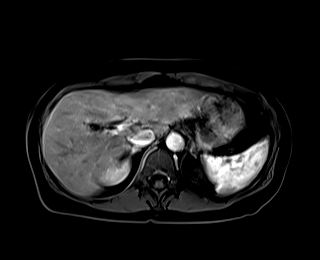
[im 88/88]
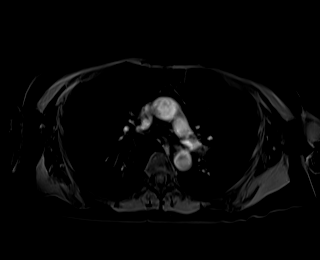

[Series 18: T1 dynamic · axial · 3.0mm · 1.09mm/px · z∈[-41,+220]mm · 3 of 88 slices shown (3 of 10)]
[im 1/88]
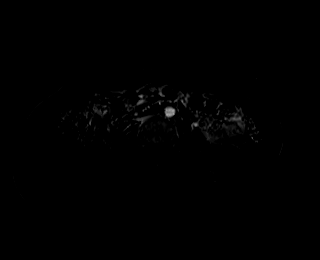
[im 44/88]
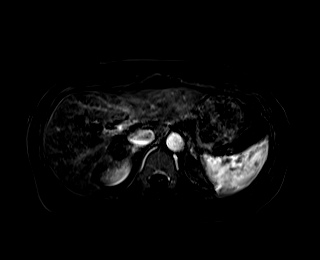
[im 88/88]
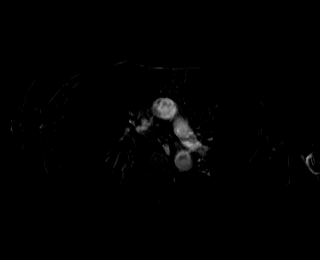

[Series 22: T1 dynamic · axial · 3.0mm · 1.09mm/px · z∈[-41,+220]mm · 3 of 88 slices shown (4 of 10)]
[im 1/88]
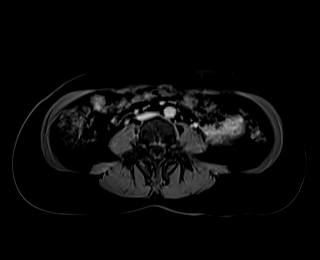
[im 44/88]
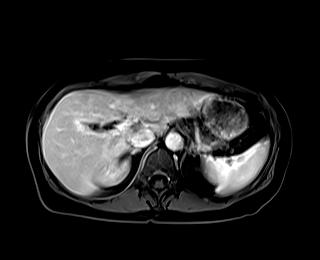
[im 88/88]
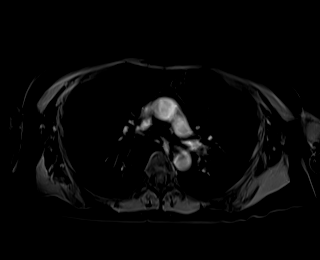

[Series 23: T1 dynamic · axial · 3.0mm · 1.09mm/px · z∈[-41,+220]mm · 3 of 88 slices shown (5 of 10)]
[im 1/88]
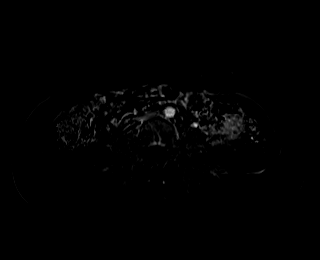
[im 44/88]
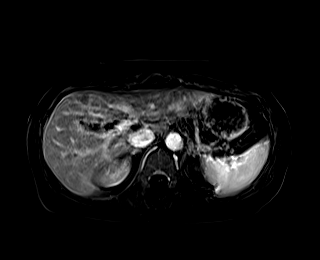
[im 88/88]
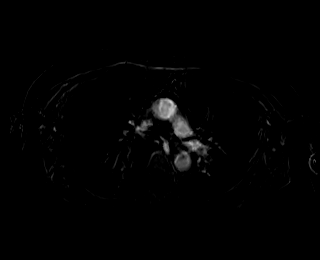

[Series 26: T1 dynamic · axial · 3.0mm · 1.09mm/px · z∈[-41,+220]mm · 3 of 88 slices shown (6 of 10)]
[im 1/88]
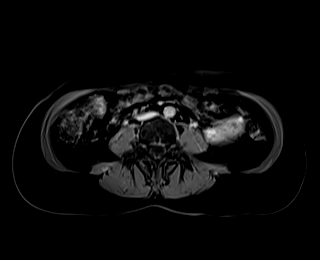
[im 44/88]
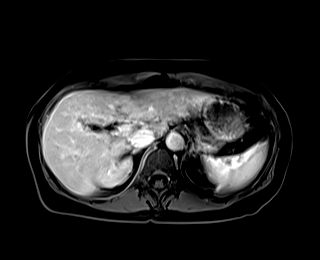
[im 88/88]
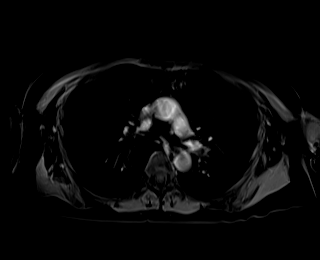

[Series 27: T1 dynamic · axial · 3.0mm · 1.09mm/px · z∈[-41,+220]mm · 3 of 88 slices shown (7 of 10)]
[im 1/88]
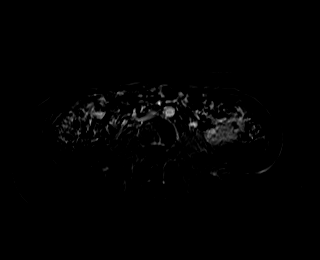
[im 44/88]
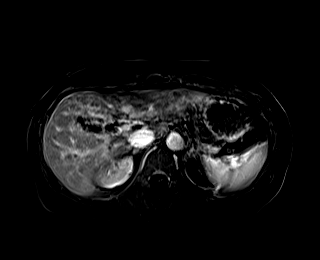
[im 88/88]
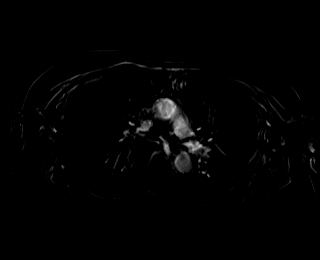

[Series 29: T1 dynamic · coronal · 4.0mm · 1.34mm/px · 2 of 60 slices shown (8 of 10)]
[im 1/60]
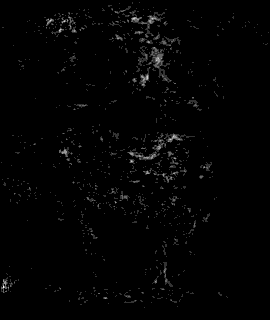
[im 60/60]
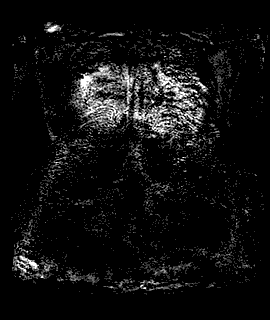

[Series 30: T2 · axial · 6.0mm · 1.37mm/px · 1 of 36 slices shown (2 of 2)]
[im 1/36]
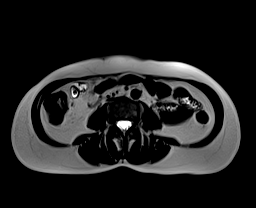

[Series 33: T1 dynamic · axial · 3.0mm · 1.09mm/px · z∈[-41,+220]mm · 3 of 88 slices shown (9 of 10)]
[im 1/88]
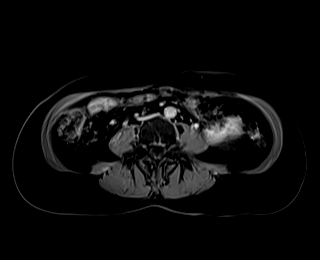
[im 44/88]
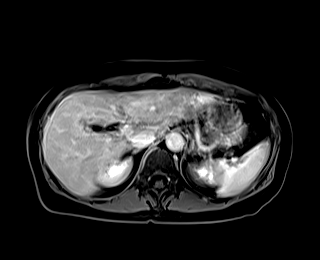
[im 88/88]
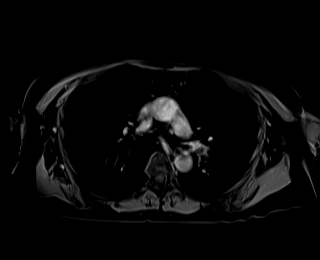

[Series 34: T1 dynamic · axial · 3.0mm · 1.09mm/px · z∈[-41,+220]mm · 3 of 88 slices shown (10 of 10)]
[im 1/88]
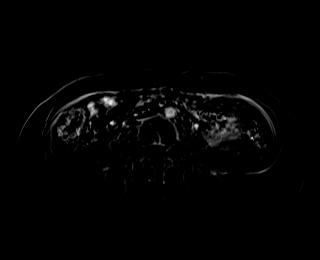
[im 44/88]
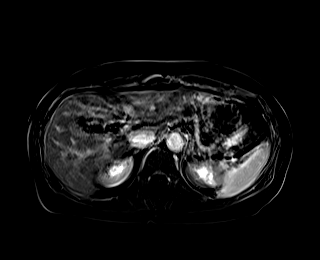
[im 88/88]
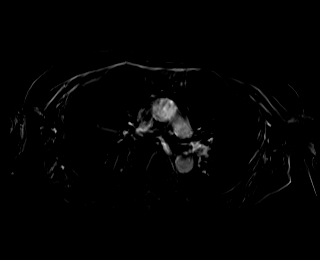

[Series 100: MR series · axial · 0.83mm/px · 1 of 11 slices shown]
[im 1/11]
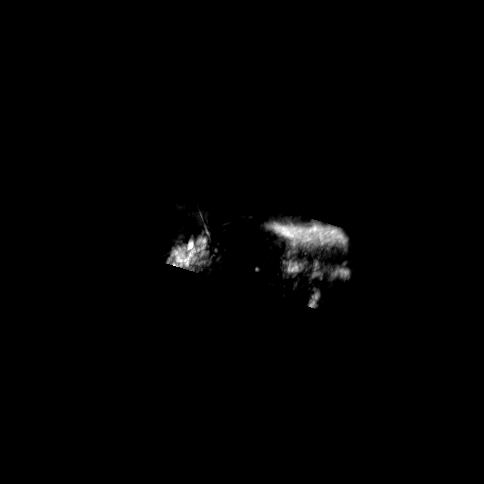

[46 of 48 positions shown; findings below may reference images not displayed]

FINDINGS: Lower chest: No acute abnormality.

Hepatobiliary: No hepatic steatosis. No suspicious hepatic lesion.
Gallbladder surgically absent. No biliary ductal dilation.

Pancreas: Stable cystic lesion in the head of the pancreas measuring
17 x 17 x 6 mm on images [DATE] in [DATE] previously measuring 18 x 17 x
6 mm, again demonstrating lobulated outer contour with a few thin
internal septations. Without postcontrast enhancement or wall
thickening. This again appears to communicate with a nondilated main
pancreatic duct. No additional pancreatic masses. No pancreatic
divisum or long common channel.

Spleen:  Within normal limits in size and appearance.

Adrenals/Urinary Tract: No masses identified. No evidence of
hydronephrosis.

Stomach/Bowel: Visualized portions within the abdomen are
unremarkable.

Vascular/Lymphatic: No pathologically enlarged lymph nodes
identified. No abdominal aortic aneurysm demonstrated.

Other:  No abdominal ascites.

Musculoskeletal: No suspicious bone lesions identified.
IMPRESSION: Stable 17 mm cystic lesion in the head of the pancreas, again
demonstrating MRI features most consistent with side branch
intraductal papillary mucinous neoplasm. No high risk MRI findings.
Recommend follow up pre and post contrast MRI/MRCP in 1 year. This
recommendation follows ACR consensus guidelines: Management of
Incidental Pancreatic Cysts: A White Paper of the ACR Incidental
Findings Committee. [HOSPITAL] [32];[DATE].

## 2021-04-06 IMAGING — MR MR 3D RECON AT [facility]
19 of 20 series · 46 of 48 positions shown · IV contrast (gadavist)
Comparison: MRI [DATE]

CLINICAL DATA: Follow-up cystic pancreatic lesion.

EXAM:
MRI ABDOMEN WITHOUT AND WITH CONTRAST
TECHNIQUE: Multiplanar multisequence MR imaging of the abdomen was performed
both before and after the administration of intravenous contrast.
CONTRAST:  5mL GADAVIST GADOBUTROL 1 MMOL/ML IV SOLN

[Series 2: DWI · axial · 6.0mm · 1.36mm/px · z∈[-52,+207]mm · 4 of 74 slices shown (1 of 2)]
[im 1/74]
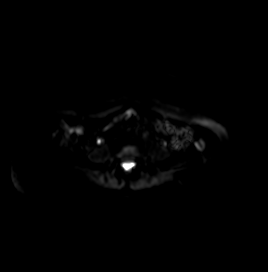
[im 25/74]
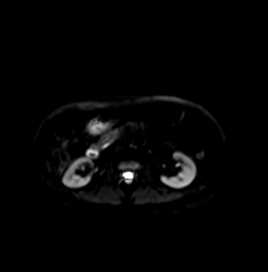
[im 49/74]
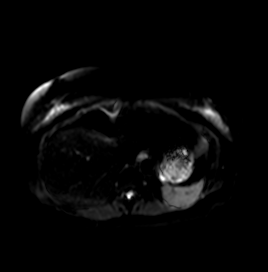
[im 74/74]
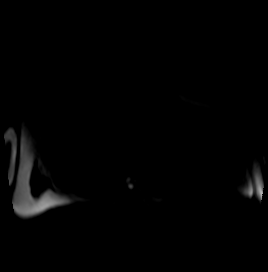

[Series 3: DWI · axial · 6.0mm · 1.36mm/px · z∈[-52,+207]mm · 2 of 37 slices shown (2 of 2)]
[im 1/37]
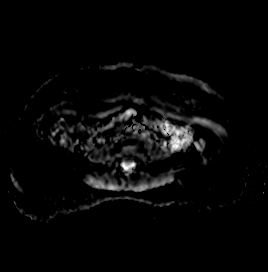
[im 37/37]
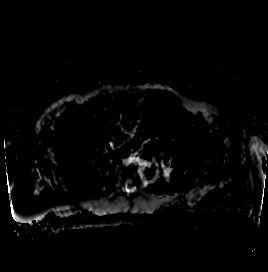

[Series 5: T2 fat-sat · axial · 6.0mm · 1.09mm/px · 1 of 36 slices shown]
[im 1/36]
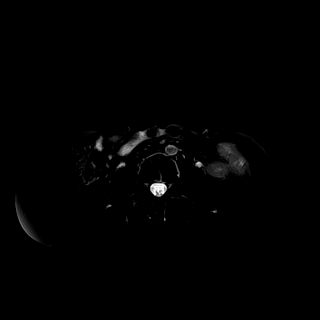

[Series 7: T2 · coronal · 7.0mm · 1.56mm/px · 1 of 30 slices shown (1 of 2)]
[im 1/30]
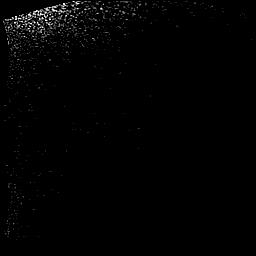

[Series 8: T1 · axial · 3.1mm · 1.09mm/px · z∈[-32,+213]mm · 3 of 80 slices shown (1 of 2)]
[im 1/80]
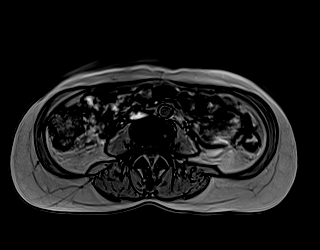
[im 40/80]
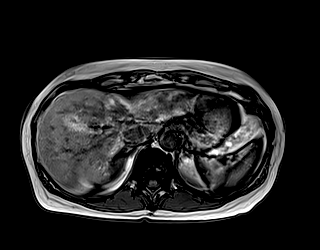
[im 80/80]
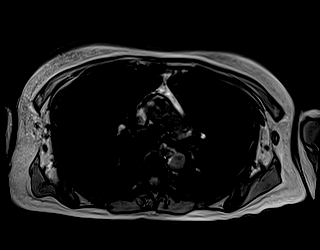

[Series 9: T1 · axial · 3.1mm · 1.09mm/px · z∈[-32,+213]mm · 3 of 80 slices shown (2 of 2)]
[im 1/80]
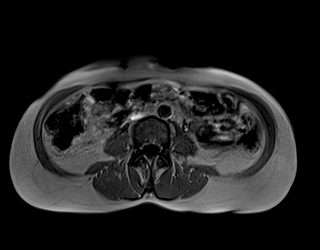
[im 40/80]
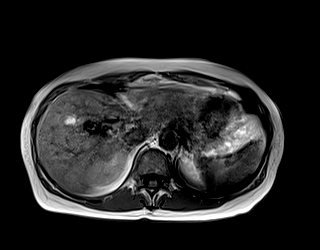
[im 80/80]
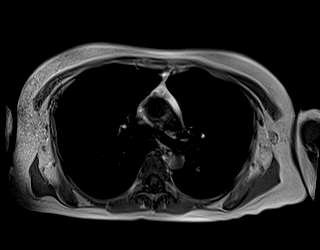

[Series 10: bSSFP · axial · 7.0mm · 1.12mm/px · 1 of 32 slices shown]
[im 1/32]
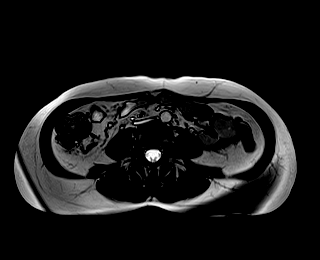

[Series 13: T1 dynamic · axial · 3.0mm · 1.09mm/px · z∈[-41,+220]mm · 3 of 88 slices shown (1 of 10)]
[im 1/88]
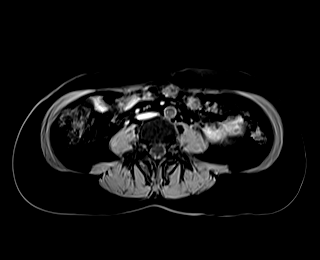
[im 44/88]
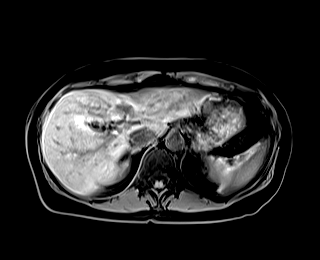
[im 88/88]
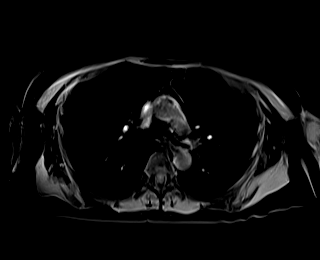

[Series 17: T1 dynamic · axial · 3.0mm · 1.09mm/px · z∈[-41,+220]mm · 3 of 88 slices shown (2 of 10)]
[im 1/88]
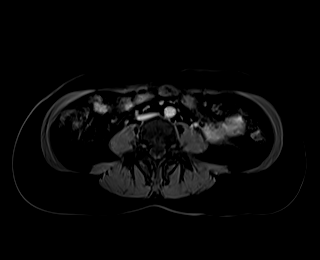
[im 44/88]
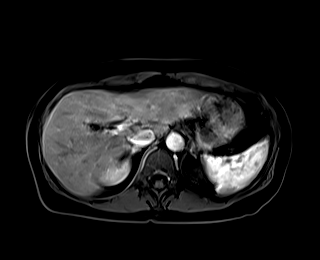
[im 88/88]
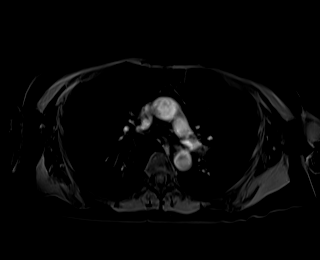

[Series 18: T1 dynamic · axial · 3.0mm · 1.09mm/px · z∈[-41,+220]mm · 3 of 88 slices shown (3 of 10)]
[im 1/88]
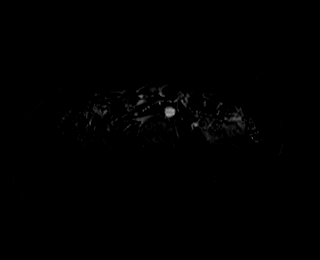
[im 44/88]
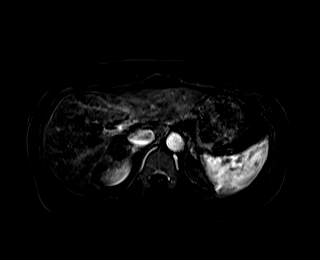
[im 88/88]
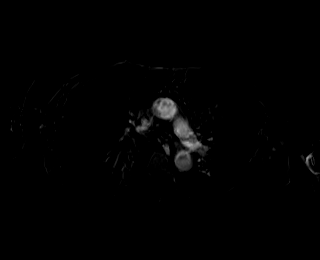

[Series 22: T1 dynamic · axial · 3.0mm · 1.09mm/px · z∈[-41,+220]mm · 3 of 88 slices shown (4 of 10)]
[im 1/88]
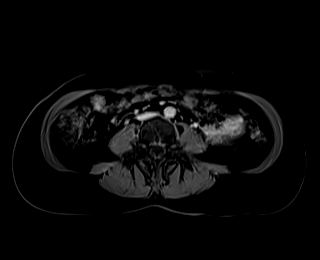
[im 44/88]
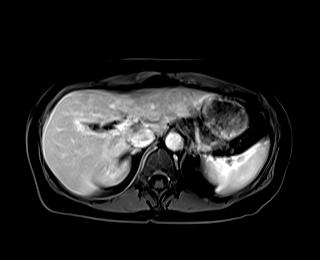
[im 88/88]
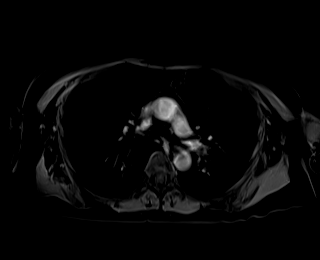

[Series 23: T1 dynamic · axial · 3.0mm · 1.09mm/px · z∈[-41,+220]mm · 3 of 88 slices shown (5 of 10)]
[im 1/88]
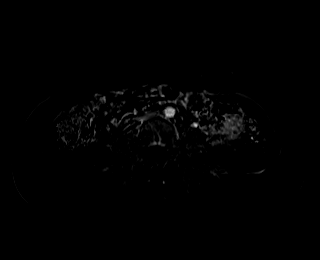
[im 44/88]
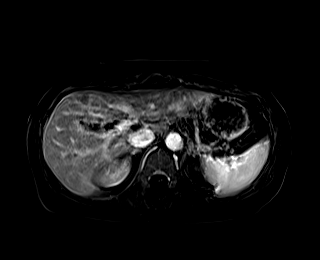
[im 88/88]
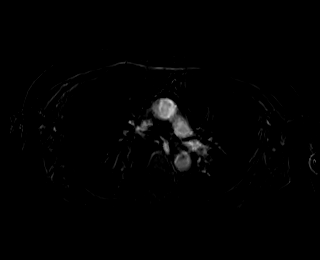

[Series 26: T1 dynamic · axial · 3.0mm · 1.09mm/px · z∈[-41,+220]mm · 3 of 88 slices shown (6 of 10)]
[im 1/88]
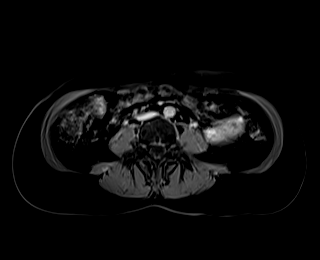
[im 44/88]
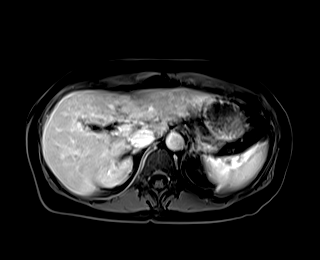
[im 88/88]
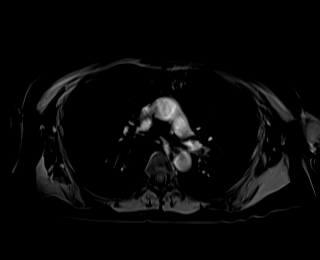

[Series 27: T1 dynamic · axial · 3.0mm · 1.09mm/px · z∈[-41,+220]mm · 3 of 88 slices shown (7 of 10)]
[im 1/88]
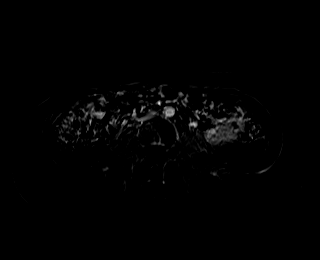
[im 44/88]
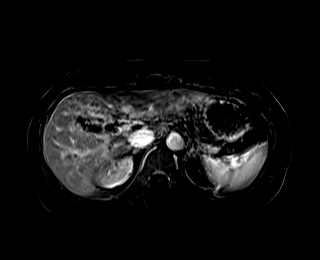
[im 88/88]
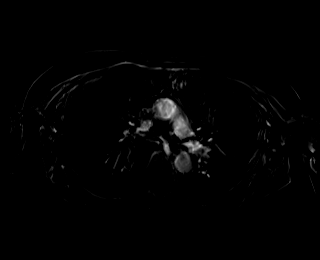

[Series 29: T1 dynamic · coronal · 4.0mm · 1.34mm/px · 2 of 60 slices shown (8 of 10)]
[im 1/60]
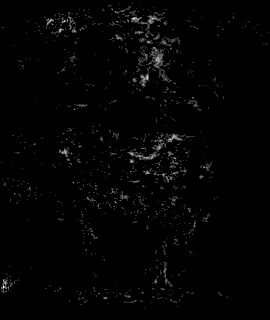
[im 60/60]
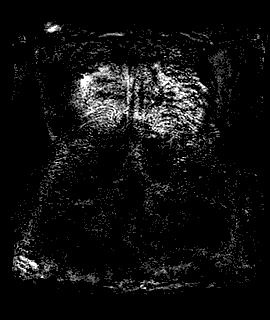

[Series 30: T2 · axial · 6.0mm · 1.37mm/px · 1 of 36 slices shown (2 of 2)]
[im 1/36]
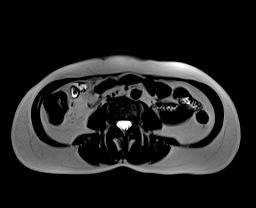

[Series 33: T1 dynamic · axial · 3.0mm · 1.09mm/px · z∈[-41,+220]mm · 3 of 88 slices shown (9 of 10)]
[im 1/88]
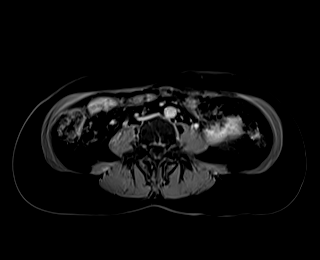
[im 44/88]
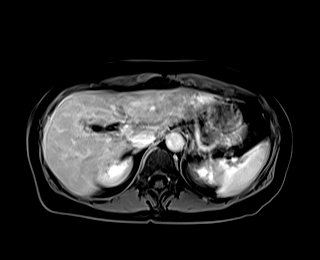
[im 88/88]
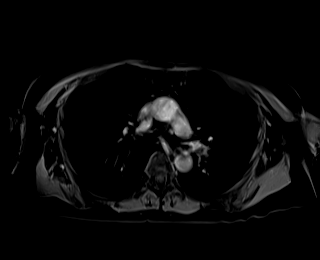

[Series 34: T1 dynamic · axial · 3.0mm · 1.09mm/px · z∈[-41,+220]mm · 3 of 88 slices shown (10 of 10)]
[im 1/88]
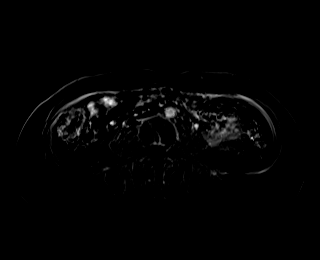
[im 44/88]
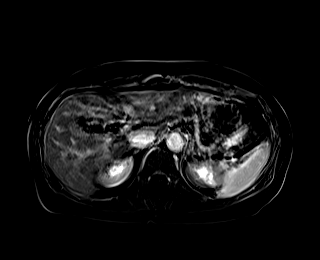
[im 88/88]
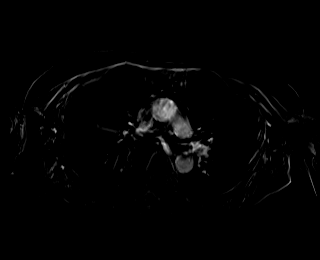

[Series 100: MR series · axial · 0.83mm/px · 1 of 11 slices shown]
[im 1/11]
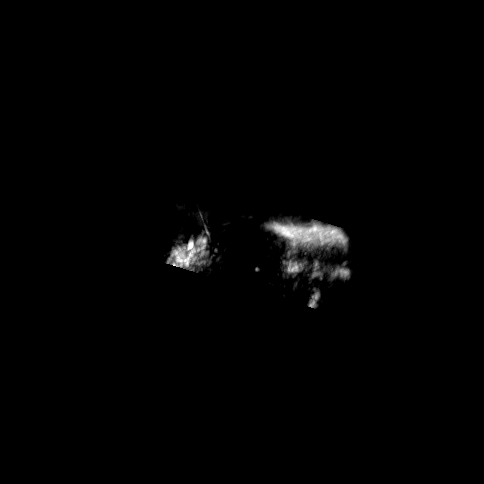

[46 of 48 positions shown; findings below may reference images not displayed]

FINDINGS: Lower chest: No acute abnormality.

Hepatobiliary: No hepatic steatosis. No suspicious hepatic lesion.
Gallbladder surgically absent. No biliary ductal dilation.

Pancreas: Stable cystic lesion in the head of the pancreas measuring
17 x 17 x 6 mm on images [DATE] in [DATE] previously measuring 18 x 17 x
6 mm, again demonstrating lobulated outer contour with a few thin
internal septations. Without postcontrast enhancement or wall
thickening. This again appears to communicate with a nondilated main
pancreatic duct. No additional pancreatic masses. No pancreatic
divisum or long common channel.

Spleen:  Within normal limits in size and appearance.

Adrenals/Urinary Tract: No masses identified. No evidence of
hydronephrosis.

Stomach/Bowel: Visualized portions within the abdomen are
unremarkable.

Vascular/Lymphatic: No pathologically enlarged lymph nodes
identified. No abdominal aortic aneurysm demonstrated.

Other:  No abdominal ascites.

Musculoskeletal: No suspicious bone lesions identified.
IMPRESSION: Stable 17 mm cystic lesion in the head of the pancreas, again
demonstrating MRI features most consistent with side branch
intraductal papillary mucinous neoplasm. No high risk MRI findings.
Recommend follow up pre and post contrast MRI/MRCP in 1 year. This
recommendation follows ACR consensus guidelines: Management of
Incidental Pancreatic Cysts: A White Paper of the ACR Incidental
Findings Committee. [HOSPITAL] [32];[DATE].

## 2021-04-06 MED ORDER — OXYCODONE HCL 5 MG PO TABS: 5.0000 mg | ORAL_TABLET | ORAL | 0 refills | Status: DC | PRN

## 2021-04-06 MED ORDER — IBUPROFEN 800 MG PO TABS
800.0000 mg | ORAL_TABLET | Freq: Three times a day (TID) | ORAL | 0 refills | Status: DC | PRN
Start: 2021-04-06 — End: 2021-05-13

## 2021-04-06 MED ORDER — GADOBUTROL 1 MMOL/ML IV SOLN
5.0000 mL | Freq: Once | INTRAVENOUS | Status: AC | PRN
  Administered 2021-04-06: 5 mL via INTRAVENOUS

## 2021-04-06 MED ORDER — SENNOSIDES-DOCUSATE SODIUM 8.6-50 MG PO TABS: 2.0000 | ORAL_TABLET | Freq: Every day | ORAL | 0 refills | Status: DC

## 2021-04-06 NOTE — Patient Instructions (Addendum)
Preparing for your Surgery  Plan for surgery on April 20, 2021 with Dr. Jeral Pinch at [place] will be scheduled for robotic assisted total laparoscopic hysterectomy (removal of the uterus and cervix), left salpingo-oophorectomy (removal of left ovary and fallopian tube), peritoneal washings, possible laparotomy (larger incision on your abdomen if needed).  Plan to proceed with your MRI later today and Dr. Berline Lopes will contact you with the results.   Pre-operative Testing - (Done, scheduled 04/08/21) `You will receive a phone call from presurgical testing at [facility] to arrange for a pre-operative appointment and lab work.  -Bring your insurance card, copy of an advanced directive if applicable, medication list  -At that visit, you will be asked to sign a consent for a possible blood transfusion in case a transfusion becomes necessary during surgery.  The need for a blood transfusion is rare but having consent is a necessary part of your care.     -You should not be taking blood thinners or aspirin at least ten days prior to surgery unless instructed by your surgeon.  -Do not take supplements such as fish oil (omega 3), red yeast rice, turmeric before your surgery. You want to avoid medications with aspirin in them including headache powders such as BC or Goody's), Excedrin migraine.  Day Before Surgery at [place] will be asked to take in a light diet the day before surgery. You will be advised you can have clear liquids up until 3 hours before your surgery.    Eat a light diet the day before surgery.  Examples including soups, broths, toast, yogurt, mashed potatoes.  AVOID GAS PRODUCING FOODS. Things to avoid include carbonated beverages (fizzy beverages, sodas), raw fruits and raw vegetables (uncooked), or beans.   If your bowels are filled with gas, your surgeon will have difficulty visualizing your pelvic organs which increases your surgical  risks.  Your role in recovery Your role is to become active as soon as directed by your doctor, while still giving yourself time to heal.  Rest when you feel tired. You will be asked to do the following in order to speed your recovery:  - Cough and breathe deeply. This helps to clear and expand your lungs and can prevent pneumonia after surgery.  - [place]. Do mild physical activity. Walking or moving your legs help your circulation and body functions return to normal. Do not try to get up or walk alone the first time after surgery.   -If you develop swelling on one leg or the other, pain in the back of your leg, redness/warmth in one of your legs, please call the office or go to the Emergency Room to have a doppler to rule out a blood clot. For shortness of breath, chest pain-seek care in the Emergency Room as soon as possible. - Actively manage your pain. Managing your pain lets you move in comfort. We will ask you to rate your pain on a scale of zero to 10. It is your responsibility to tell your doctor or nurse where and how much you hurt so your pain can be treated.  Special Considerations -Your final pathology results from surgery should be available around one week after surgery and the results will be relayed to you when available.  -Dr. Lahoma Crocker is the surgeon that assists your GYN Oncologist with surgery.  If you end up staying the night, the next day after your surgery you will either see  Dr. Berline Lopes or Dr. Lahoma Crocker.  -FMLA forms can be faxed to [phone] and please allow 5-7 business days for completion.  Pain Management After Surgery -You have been prescribed your pain medication and bowel regimen medications before surgery so that you can have these available when you are discharged from the hospital. The pain medication is for use ONLY AFTER surgery and a new prescription will not be given.   -Make sure that you have Tylenol and Ibuprofen  at home to use on a regular basis after surgery for pain control. We recommend alternating the medications every hour to six hours since they work differently and are processed in the body differently for pain relief.  -Review the attached handout on narcotic use and their risks and side effects.   Bowel Regimen -You have been prescribed Sennakot-S to take nightly to prevent constipation especially if you are taking the narcotic pain medication intermittently.  It is important to prevent constipation and drink adequate amounts of liquids. You can stop taking this medication when you are not taking pain medication and you are back on your normal bowel routine.  Risks of Surgery Risks of surgery are low but include bleeding, infection, damage to surrounding structures, re-operation, blood clots, and very rarely death.   Blood Transfusion Information (For the consent to be signed before surgery)  We will be checking your blood type before surgery so in case of emergencies, we will know what type of blood you would need.                                            WHAT IS A BLOOD TRANSFUSION?  A transfusion is the replacement of blood or some of its parts. Blood is made up of multiple cells which provide different functions. Red blood cells carry oxygen and are used for blood loss replacement. White blood cells fight against infection. Platelets control bleeding. Plasma helps clot blood. Other blood products are available for specialized needs, such as hemophilia or other clotting disorders. BEFORE THE TRANSFUSION  Who gives blood for transfusions?  You may be able to donate blood to be used at a later date on yourself (autologous donation). Relatives can be asked to donate blood. This is generally not any safer than if you have received blood from a stranger. The same precautions are taken to ensure safety when a relative's blood is donated. Healthy volunteers who are fully evaluated to make  sure their blood is safe. This is blood bank blood. Transfusion therapy is the safest it has ever been in the practice of medicine. Before blood is taken from a donor, a complete history is taken to make sure that person has no history of diseases nor engages in risky social behavior (examples are intravenous drug use or sexual activity with multiple partners). The donor's travel history is screened to minimize risk of transmitting infections, such as malaria. The donated blood is tested for signs of infectious diseases, such as HIV and hepatitis. The blood is then tested to be sure it is compatible with you in order to minimize the chance of a transfusion reaction. If you or a relative donates blood, this is often done in anticipation of surgery and is not appropriate for emergency situations. It takes many days to process the donated blood. RISKS AND COMPLICATIONS Although transfusion therapy is very safe and saves many lives,  the main dangers of transfusion include:  Getting an infectious disease. Developing a transfusion reaction. This is an allergic reaction to something in the blood you were given. Every precaution is taken to prevent this. The decision to have a blood transfusion has been considered carefully by your caregiver before blood is given. Blood is not given unless the benefits outweigh the risks.  AFTER SURGERY INSTRUCTIONS  Return to work: 4-6 weeks if applicable  Activity: 1. Be up and out of the bed during the day.  Take a nap if needed.  You may walk up steps but be careful and use the hand rail.  Stair climbing will tire you more than you think, you may need to stop part way and rest.   2. No lifting or straining for 6 weeks over 10 pounds. No pushing, pulling, straining for 6 weeks.  3. No driving for 1 week(s).  Do not drive if you are taking narcotic pain medicine and make sure that your reaction time has returned.   4. You can shower as soon as the next day after  surgery. Shower daily.  Use your regular soap and water (not directly on the incision) and pat your incision(s) dry afterwards; don't rub.  No tub baths or submerging your body in water until cleared by your surgeon. If you have the soap that was given to you by pre-surgical testing that was used before surgery, you do not need to use it afterwards because this can irritate your incisions.   5. No sexual activity and nothing in the vagina for 8 weeks.  6. You may experience a small amount of clear drainage from your incisions, which is normal.  If the drainage persists, increases, or changes color please call the office.  7. Do not use creams, lotions, or ointments such as neosporin on your incisions after surgery until advised by your surgeon because they can cause removal of the dermabond glue on your incisions.    8. You may experience vaginal spotting after surgery or around the 6-8 week mark from surgery when the stitches at the top of the vagina begin to dissolve.  The spotting is normal but if you experience heavy bleeding, call our office.  9. Take Tylenol or ibuprofen first for pain and only use narcotic pain medication for severe pain not relieved by the Tylenol or Ibuprofen.  Monitor your Tylenol intake to a max of 4,000 mg in a 24 hour period. You can alternate these medications after surgery.  Diet: 1. Low sodium Heart Healthy Diet is recommended but you are cleared to resume your normal (before surgery) diet after your procedure.  2. It is safe to use a laxative, such as Miralax or Colace, if you have difficulty moving your bowels. You have been prescribed Sennakot at bedtime every evening to keep bowel movements regular and to prevent constipation.    Wound Care: 1. Keep clean and dry.  Shower daily.  Reasons to call the Doctor: Fever - Oral temperature greater than 100.4 degrees Fahrenheit Foul-smelling vaginal discharge Difficulty urinating Nausea and vomiting Increased pain  at the site of the incision that is unrelieved with pain medicine. Difficulty breathing with or without chest pain New calf pain especially if only on one side Sudden, continuing increased vaginal bleeding with or without clots.   Contacts: For questions or concerns you should contact:  Dr. Jeral Pinch at [phone]  Joylene John, NP at [phone]  After Hours: call [phone] and have the GYN Oncologist  paged/contacted (after 5 pm or on the weekends).  Messages sent via mychart are for non-urgent matters and are not responded to after hours so for urgent needs, please call the after hours number.

## 2021-04-06 NOTE — Progress Notes (Signed)
Patient here for a pre-operative appointment prior to her scheduled surgery on April 20, 2021. She is scheduled for robotic assisted total laparoscopic hysterectomy, left salpingo-oophorectomy, peritoneal washings, possible laparotomy.  She has her pre-admission testing appointment on 04/08/2021 at [facility].  The surgery was discussed in detail.  See after visit summary for additional details. Visual aids used to discuss items related to surgery including sequential compression stockings, foley catheter, IV pump, multi-modal pain regimen including tylenol, photo of the surgical robot, female reproductive system to discuss surgery in detail.      Discussed post-op pain management in detail including the aspects of the enhanced recovery pathway.  Advised her that a new prescription would be sent in for oxycodone and it is only to be used for after her upcoming surgery.  We discussed the use of tylenol post-op and to monitor for a maximum of 4,000 mg in a 24 hour period.  800 mg ibuprofen prescribed along with sennakot-S to be used after surgery and to hold if having loose stools.  Discussed bowel regimen in detail.     Discussed the use of SCDs and measures to take at home to prevent DVT including frequent mobility.  Reportable signs and symptoms of DVT discussed. Post-operative instructions discussed and expectations for after surgery. Incisional care discussed as well including reportable signs and symptoms including erythema, drainage, wound separation.     5 minutes spent with the patient.  Verbalizing understanding of material discussed. No needs or concerns voiced at the end of the visit.   Advised patient to call for any needs.  Advised that her post-operative medications had been prescribed and could be picked up at any time.     This appointment is included in the global surgical bundle as pre-operative teaching and has no charge.

## 2021-04-07 NOTE — Progress Notes (Addendum)
COVID swab appointment: n/a  COVID Vaccine Completed: yes x3 Date COVID Vaccine completed: 08/03/19, 08/28/19 Has received booster: COVID vaccine manufacturer: Pfizer      Date of COVID positive in last 90 days: no  PCP - Linda Hedges, DO Cardiologist - n/a  Chest x-ray - n/a EKG - n/a Stress Test - n/a ECHO - n/a Cardiac Cath - n/a Pacemaker/ICD device last checked: n/a Spinal Cord Stimulator: n/a  Sleep Study - n/a CPAP -   Fasting Blood Sugar - n/a Checks Blood Sugar _____ times a day  Blood Thinner Instructions: n/a Aspirin Instructions: Last Dose:  Activity level: Can go up a flight of stairs and perform activities of daily living without stopping and without symptoms of chest pain or shortness of breath.       Anesthesia review:   Patient denies shortness of breath, fever, cough and chest pain at PAT appointment   Patient verbalized understanding of instructions that were given to them at the PAT appointment. Patient was also instructed that they will need to review over the PAT instructions again at home before surgery.

## 2021-04-07 NOTE — Patient Instructions (Addendum)
DUE TO COVID-19 ONLY ONE VISITOR IS ALLOWED TO COME WITH YOU AND STAY IN THE WAITING ROOM ONLY DURING PRE OP AND PROCEDURE.   **NO VISITORS ARE ALLOWED IN THE SHORT STAY AREA OR RECOVERY ROOM!!**       Your procedure is scheduled on: 04/20/21   Report to [facility] Main Entrance    Report to admitting at 5:15 AM   Call this number if you have problems the morning of surgery [phone]   Do not eat food :After Midnight.   May have liquids until 4:30 AM day of surgery  CLEAR LIQUID DIET  Foods Allowed                                                                     Foods Excluded  Water, Black Coffee and tea (no milk or creamer)           liquids that you cannot  Plain Jell-O in any flavor  (No red)                                     see through such as: Fruit ices (not with fruit pulp)                                            milk, soups, orange juice              Iced Popsicles (No red)                                               All solid food                                   Apple juices Sports drinks like Gatorade (No red) Lightly seasoned clear broth or consume(fat free) Sugar   Oral Hygiene is also important to reduce your risk of infection.                                    Remember - BRUSH YOUR TEETH THE MORNING OF SURGERY WITH YOUR REGULAR TOOTHPASTE   Take these medicines the morning of surgery with A SIP OF WATER: None                              You may not have any metal on your body including hair pins, jewelry, and body piercing             Do not wear make-up, lotions, powders, perfumes, or deodorant  Do not wear nail polish including gel and S&S, artificial/acrylic nails, or any other type of covering on natural nails including finger and toenails. If you have artificial nails, gel coating, etc. that needs to be removed  by a nail salon please have this removed prior to surgery or surgery may need to be canceled/ delayed if the surgeon/  anesthesia feels like they are unable to be safely monitored.   Do not shave  48 hours prior to surgery.    Do not bring valuables to the hospital. [place].   Patients discharged on the day of surgery will not be allowed to drive home.   Special Instructions: Bring a copy of your healthcare power of attorney and living will documents         the day of surgery if you haven't scanned them before.              Please read over the following fact sheets you were given: IF YOU HAVE QUESTIONS ABOUT YOUR PRE-OP INSTRUCTIONS PLEASE CALL [facility] - Preparing for Surgery Before surgery, you can play an important role.  Because skin is not sterile, your skin needs to be as free of germs as possible.  You can reduce the number of germs on your skin by washing with CHG (chlorahexidine gluconate) soap before surgery.  CHG is an antiseptic cleaner which kills germs and bonds with the skin to continue killing germs even after washing. Please DO NOT use if you have an allergy to CHG or antibacterial soaps.  If your skin becomes reddened/irritated stop using the CHG and inform your nurse when you arrive at Short Stay. Do not shave (including legs and underarms) for at least 48 hours prior to the first CHG shower.  You may shave your face/neck.  Please follow these instructions carefully:  1.  Shower with CHG Soap the night before surgery and the  morning of surgery.  2.  If you choose to wash your hair, wash your hair first as usual with your normal  shampoo.  3.  After you shampoo, rinse your hair and body thoroughly to remove the shampoo.                             4.  Use CHG as you would any other liquid soap.  You can apply chg directly to the skin and wash.  Gently with a scrungie or clean washcloth.  5.  Apply the CHG Soap to your body ONLY FROM THE NECK DOWN.   Do   not use on face/ open                           Wound or  open sores. Avoid contact with eyes, ears mouth and   genitals (private parts).                       Wash face,  Genitals (private parts) with your normal soap.             6.  Wash thoroughly, paying special attention to the area where your    surgery  will be performed.  7.  Thoroughly rinse your body with warm water from the neck down.  8.  DO NOT shower/wash with your normal soap after using and rinsing off the CHG Soap.                9.  Pat yourself dry with a clean towel.  10.  Wear clean pajamas.            11.  Place clean sheets on your bed the night of your first shower and do not  sleep with pets. Day of Surgery : Do not apply any lotions/deodorants the morning of surgery.  Please wear clean clothes to the hospital/surgery center.  FAILURE TO FOLLOW THESE INSTRUCTIONS MAY RESULT IN THE CANCELLATION OF YOUR SURGERY  PATIENT SIGNATURE_________________________________  NURSE SIGNATURE__________________________________  ________________________________________________________________________  WHAT IS A BLOOD TRANSFUSION? Blood Transfusion Information  A transfusion is the replacement of blood or some of its parts. Blood is made up of multiple cells which provide different functions. Red blood cells carry oxygen and are used for blood loss replacement. White blood cells fight against infection. Platelets control bleeding. Plasma helps clot blood. Other blood products are available for specialized needs, such as hemophilia or other clotting disorders. BEFORE THE TRANSFUSION  Who gives blood for transfusions?  Healthy volunteers who are fully evaluated to make sure their blood is safe. This is blood bank blood. Transfusion therapy is the safest it has ever been in the practice of medicine. Before blood is taken from a donor, a complete history is taken to make sure that person has no history of diseases nor engages in risky social behavior (examples are intravenous drug  use or sexual activity with multiple partners). The donor's travel history is screened to minimize risk of transmitting infections, such as malaria. The donated blood is tested for signs of infectious diseases, such as HIV and hepatitis. The blood is then tested to be sure it is compatible with you in order to minimize the chance of a transfusion reaction. If you or a relative donates blood, this is often done in anticipation of surgery and is not appropriate for emergency situations. It takes many days to process the donated blood. RISKS AND COMPLICATIONS Although transfusion therapy is very safe and saves many lives, the main dangers of transfusion include:  Getting an infectious disease. Developing a transfusion reaction. This is an allergic reaction to something in the blood you were given. Every precaution is taken to prevent this. The decision to have a blood transfusion has been considered carefully by your caregiver before blood is given. Blood is not given unless the benefits outweigh the risks. AFTER THE TRANSFUSION Right after receiving a blood transfusion, you will usually feel much better and more energetic. This is especially true if your red blood cells have gotten low (anemic). The transfusion raises the level of the red blood cells which carry oxygen, and this usually causes an energy increase. The nurse administering the transfusion will monitor you carefully for complications. HOME CARE INSTRUCTIONS  No special instructions are needed after a transfusion. You may find your energy is better. Speak with your caregiver about any limitations on activity for underlying diseases you may have. SEEK MEDICAL CARE IF:  Your condition is not improving after your transfusion. You develop redness or irritation at the intravenous (IV) site. SEEK IMMEDIATE MEDICAL CARE IF:  Any of the following symptoms occur over the next 12 hours: Shaking chills. You have a temperature by mouth above 102 F  (38.9 C), not controlled by medicine. Chest, back, or muscle pain. People around you feel you are not acting correctly or are confused. Shortness of breath or difficulty breathing. Dizziness and fainting. You get a rash or develop hives. You have a decrease in urine output. Your urine turns a dark color or changes to pink,  red, or brown. Any of the following symptoms occur over the next 10 days: You have a temperature by mouth above 102 F (38.9 C), not controlled by medicine. Shortness of breath. Weakness after normal activity. The white part of the eye turns yellow (jaundice). You have a decrease in the amount of urine or are urinating less often. Your urine turns a dark color or changes to pink, red, or brown. Document Released: 05/06/2000 Document Revised: 08/01/2011 Document Reviewed: 12/24/2007 [facility] Patient Information 2014 [place], [place].  _______________________________________________________________________

## 2021-04-08 ENCOUNTER — Other Ambulatory Visit: Payer: Self-pay

## 2021-04-08 ENCOUNTER — Encounter (HOSPITAL_COMMUNITY): Payer: Self-pay

## 2021-04-08 ENCOUNTER — Encounter (HOSPITAL_COMMUNITY)
Admission: RE | Admit: 2021-04-08 | Discharge: 2021-04-08 | Disposition: A | Discharge status: Home / Self Care | Payer: 59 | Source: Ambulatory Visit | Attending: Gynecologic Oncology | Admitting: Gynecologic Oncology

## 2021-04-08 DIAGNOSIS — N83202 Unspecified ovarian cyst, left side: Secondary | ICD-10-CM | POA: Insufficient documentation

## 2021-04-08 DIAGNOSIS — Z01812 Encounter for preprocedural laboratory examination: Secondary | ICD-10-CM | POA: Diagnosis not present

## 2021-04-08 DIAGNOSIS — R859 Unspecified abnormal finding in specimens from digestive organs and abdominal cavity: Secondary | ICD-10-CM | POA: Diagnosis not present

## 2021-04-08 HISTORY — DX: Benign neoplasm of thyroid gland: D34

## 2021-04-08 LAB — URINALYSIS, ROUTINE W REFLEX MICROSCOPIC
Bilirubin Urine: NEGATIVE
Glucose, UA: NEGATIVE mg/dL
Ketones, ur: NEGATIVE mg/dL
Leukocytes,Ua: NEGATIVE
Nitrite: NEGATIVE
Protein, ur: NEGATIVE mg/dL
Specific Gravity, Urine: 1.012 (ref 1.005–1.030)
pH: 6 (ref 5.0–8.0)

## 2021-04-08 LAB — TYPE AND SCREEN
ABO/RH(D): O POS
Antibody Screen: NEGATIVE

## 2021-04-08 LAB — CBC
HCT: 42.6 % (ref 36.0–46.0)
Hemoglobin: 14.2 g/dL (ref 12.0–15.0)
MCH: 31.3 pg (ref 26.0–34.0)
MCHC: 33.3 g/dL (ref 30.0–36.0)
MCV: 93.8 fL (ref 80.0–100.0)
Platelets: 280 10*3/uL (ref 150–400)
RBC: 4.54 MIL/uL (ref 3.87–5.11)
RDW: 11.9 % (ref 11.5–15.5)
WBC: 5.6 10*3/uL (ref 4.0–10.5)
nRBC: 0 % (ref 0.0–0.2)

## 2021-04-08 LAB — BASIC METABOLIC PANEL
Anion gap: 8 (ref 5–15)
BUN: 18 mg/dL (ref 6–20)
CO2: 24 mmol/L (ref 22–32)
Calcium: 9.1 mg/dL (ref 8.9–10.3)
Chloride: 107 mmol/L (ref 98–111)
Creatinine, Ser: 0.66 mg/dL (ref 0.44–1.00)
GFR, Estimated: >60 mL/min (ref >60)
Glucose, Bld: 98 mg/dL (ref 70–99)
Potassium: 3.8 mmol/L (ref 3.5–5.1)
Sodium: 139 mmol/L (ref 135–145)

## 2021-04-08 NOTE — Progress Notes (Signed)
UA positive for Hgb and bacteria. Results routed to Dr. Berline Lopes

## 2021-04-19 ENCOUNTER — Telehealth: Payer: Self-pay

## 2021-04-19 NOTE — Telephone Encounter (Signed)
Telephone call to check on pre-operative status.  Patient compliant with pre-operative instructions.  Reinforced NPO after midnight.  No questions or concerns voiced.  Instructed to call for any needs.   

## 2021-04-19 NOTE — Anesthesia Preprocedure Evaluation (Addendum)
Anesthesia Evaluation  Patient identified by MRN, date of birth, ID band Patient awake    Reviewed: Allergy & Precautions, NPO status , Patient's Chart, lab work & pertinent test results  History of Anesthesia Complications (+) PROLONGED EMERGENCE and history of anesthetic complications  Airway Mallampati: III  TM Distance: >3 FB Neck ROM: Full    Dental  (+) Teeth Intact, Dental Advisory Given   Pulmonary neg pulmonary ROS,    Pulmonary exam normal breath sounds clear to auscultation       Cardiovascular negative cardio ROS Normal cardiovascular exam Rhythm:Regular Rate:Normal     Neuro/Psych negative neurological ROS  negative psych ROS   GI/Hepatic Neg liver ROS, GERD  ,IBS   Endo/Other  negative endocrine ROS  Renal/GU negative Renal ROS     Musculoskeletal negative musculoskeletal ROS (+)   Abdominal   Peds  Hematology negative hematology ROS (+)   Anesthesia Other Findings   Reproductive/Obstetrics ATYPICAL CELLS FOUND IN WASHINGS                            Anesthesia Physical Anesthesia Plan  ASA: 2  Anesthesia Plan: General   Post-op Pain Management: Gabapentin PO (pre-op) and Tylenol PO (pre-op)   Induction: Intravenous  PONV Risk Score and Plan: 4 or greater and Midazolam, Scopolamine patch - Pre-op, Dexamethasone and Ondansetron  Airway Management Planned: Oral ETT  Additional Equipment:   Intra-op Plan:   Post-operative Plan: Extubation in OR  Informed Consent: I have reviewed the patients History and Physical, chart, labs and discussed the procedure including the risks, benefits and alternatives for the proposed anesthesia with the patient or authorized representative who has indicated his/her understanding and acceptance.     Dental advisory given  Plan Discussed with:   Anesthesia Plan Comments: (2nd PIV after induction)       Anesthesia Quick  Evaluation

## 2021-04-20 ENCOUNTER — Ambulatory Visit (HOSPITAL_COMMUNITY): Payer: 59 | Admitting: Certified Registered Nurse Anesthetist

## 2021-04-20 ENCOUNTER — Ambulatory Visit (HOSPITAL_COMMUNITY)
Admission: RE | Admit: 2021-04-20 | Discharge: 2021-04-20 | Disposition: A | Discharge status: Home / Self Care | Payer: 59 | Source: Ambulatory Visit | Attending: Gynecologic Oncology | Admitting: Gynecologic Oncology

## 2021-04-20 ENCOUNTER — Encounter (HOSPITAL_COMMUNITY)
Admission: RE | Disposition: A | Discharge status: Home / Self Care | Payer: Self-pay | Source: Ambulatory Visit | Attending: Gynecologic Oncology

## 2021-04-20 ENCOUNTER — Encounter (HOSPITAL_COMMUNITY): Payer: Self-pay | Admitting: Gynecologic Oncology

## 2021-04-20 DIAGNOSIS — Z90721 Acquired absence of ovaries, unilateral: Secondary | ICD-10-CM | POA: Diagnosis not present

## 2021-04-20 DIAGNOSIS — R8569 Abnormal cytological findings in specimens from other digestive organs and abdominal cavity: Secondary | ICD-10-CM | POA: Insufficient documentation

## 2021-04-20 DIAGNOSIS — Z8742 Personal history of other diseases of the female genital tract: Secondary | ICD-10-CM | POA: Insufficient documentation

## 2021-04-20 DIAGNOSIS — R859 Unspecified abnormal finding in specimens from digestive organs and abdominal cavity: Secondary | ICD-10-CM

## 2021-04-20 DIAGNOSIS — N83202 Unspecified ovarian cyst, left side: Secondary | ICD-10-CM

## 2021-04-20 HISTORY — PX: ROBOTIC ASSISTED TOTAL HYSTERECTOMY WITH BILATERAL SALPINGO OOPHERECTOMY: SHX6086

## 2021-04-20 SURGERY — HYSTERECTOMY, TOTAL, ROBOT-ASSISTED, LAPAROSCOPIC, WITH BILATERAL SALPINGO-OOPHORECTOMY
Anesthesia: General

## 2021-04-20 MED ORDER — GABAPENTIN 300 MG PO CAPS
ORAL_CAPSULE | ORAL | Status: AC
  Filled 2021-04-20: qty 1

## 2021-04-20 MED ORDER — FENTANYL CITRATE (PF) 250 MCG/5ML IJ SOLN
INTRAMUSCULAR | Status: DC | PRN
  Administered 2021-04-20 (×2): 50 ug via INTRAVENOUS

## 2021-04-20 MED ORDER — ACETAMINOPHEN 500 MG PO TABS
ORAL_TABLET | ORAL | Status: AC
  Filled 2021-04-20: qty 2

## 2021-04-20 MED ORDER — CELECOXIB 200 MG PO CAPS
ORAL_CAPSULE | ORAL | Status: AC
  Filled 2021-04-20: qty 1

## 2021-04-20 MED ORDER — LIDOCAINE 20MG/ML (2%) 15 ML SYRINGE OPTIME
INTRAMUSCULAR | Status: DC | PRN
  Administered 2021-04-20: 1.5 mg/kg/h via INTRAVENOUS

## 2021-04-20 MED ORDER — SCOPOLAMINE 1 MG/3DAYS TD PT72
MEDICATED_PATCH | TRANSDERMAL | Status: AC
  Filled 2021-04-20: qty 1

## 2021-04-20 MED ORDER — LACTATED RINGERS IR SOLN
Status: DC | PRN
  Administered 2021-04-20: 1000 mL

## 2021-04-20 MED ORDER — MIDAZOLAM HCL 2 MG/2ML IJ SOLN
INTRAMUSCULAR | Status: AC
  Filled 2021-04-20: qty 2

## 2021-04-20 MED ORDER — LIDOCAINE HCL 2 % IJ SOLN
INTRAMUSCULAR | Status: AC
  Filled 2021-04-20: qty 20

## 2021-04-20 MED ORDER — SUGAMMADEX SODIUM 200 MG/2ML IV SOLN
INTRAVENOUS | Status: DC | PRN
  Administered 2021-04-20: 200 mg via INTRAVENOUS

## 2021-04-20 MED ORDER — PROMETHAZINE HCL 25 MG/ML IJ SOLN: 6.2500 mg | INTRAMUSCULAR | Status: DC | PRN

## 2021-04-20 MED ORDER — LACTATED RINGERS IV SOLN: INTRAVENOUS | Status: DC

## 2021-04-20 MED ORDER — ORAL CARE MOUTH RINSE: 15.0000 mL | Freq: Once | OROMUCOSAL | Status: AC

## 2021-04-20 MED ORDER — SCOPOLAMINE 1 MG/3DAYS TD PT72
1.0000 | MEDICATED_PATCH | TRANSDERMAL | Status: DC
  Administered 2021-04-20: 1.5 mg via TRANSDERMAL

## 2021-04-20 MED ORDER — BUPIVACAINE HCL 0.25 % IJ SOLN
INTRAMUSCULAR | Status: DC | PRN
  Administered 2021-04-20: 33 mL

## 2021-04-20 MED ORDER — ROCURONIUM BROMIDE 10 MG/ML (PF) SYRINGE
PREFILLED_SYRINGE | INTRAVENOUS | Status: DC | PRN
  Administered 2021-04-20: 50 mg via INTRAVENOUS
  Administered 2021-04-20: 10 mg via INTRAVENOUS

## 2021-04-20 MED ORDER — FENTANYL CITRATE PF 50 MCG/ML IJ SOSY
25.0000 ug | PREFILLED_SYRINGE | INTRAMUSCULAR | Status: DC | PRN
  Administered 2021-04-20: 25 ug via INTRAVENOUS

## 2021-04-20 MED ORDER — KETAMINE HCL 10 MG/ML IJ SOLN
INTRAMUSCULAR | Status: DC | PRN
  Administered 2021-04-20: 25 mg via INTRAVENOUS

## 2021-04-20 MED ORDER — MIDAZOLAM HCL 5 MG/5ML IJ SOLN
INTRAMUSCULAR | Status: DC | PRN
Start: 2021-04-20 — End: 2021-04-20
  Administered 2021-04-20: 2 mg via INTRAVENOUS

## 2021-04-20 MED ORDER — ROCURONIUM BROMIDE 10 MG/ML (PF) SYRINGE
PREFILLED_SYRINGE | INTRAVENOUS | Status: AC
  Filled 2021-04-20: qty 10

## 2021-04-20 MED ORDER — DEXAMETHASONE SODIUM PHOSPHATE 10 MG/ML IJ SOLN
INTRAMUSCULAR | Status: AC
  Filled 2021-04-20: qty 1

## 2021-04-20 MED ORDER — PROPOFOL 10 MG/ML IV BOLUS
INTRAVENOUS | Status: DC | PRN
  Administered 2021-04-20: 100 mg via INTRAVENOUS

## 2021-04-20 MED ORDER — FENTANYL CITRATE PF 50 MCG/ML IJ SOSY
PREFILLED_SYRINGE | INTRAMUSCULAR | Status: AC
  Filled 2021-04-20: qty 3

## 2021-04-20 MED ORDER — KETAMINE HCL 10 MG/ML IJ SOLN
INTRAMUSCULAR | Status: AC
  Filled 2021-04-20: qty 1

## 2021-04-20 MED ORDER — CHLORHEXIDINE GLUCONATE 0.12 % MT SOLN
15.0000 mL | Freq: Once | OROMUCOSAL | Status: AC
  Administered 2021-04-20: 15 mL via OROMUCOSAL

## 2021-04-20 MED ORDER — PROPOFOL 10 MG/ML IV BOLUS
INTRAVENOUS | Status: AC
  Filled 2021-04-20: qty 20

## 2021-04-20 MED ORDER — PHENYLEPHRINE HCL (PRESSORS) 10 MG/ML IV SOLN
INTRAVENOUS | Status: AC
  Filled 2021-04-20: qty 1

## 2021-04-20 MED ORDER — CEFAZOLIN SODIUM-DEXTROSE 2-4 GM/100ML-% IV SOLN
2.0000 g | INTRAVENOUS | Status: AC
  Administered 2021-04-20: 2 g via INTRAVENOUS

## 2021-04-20 MED ORDER — FENTANYL CITRATE (PF) 250 MCG/5ML IJ SOLN
INTRAMUSCULAR | Status: AC
  Filled 2021-04-20: qty 5

## 2021-04-20 MED ORDER — ONDANSETRON HCL 4 MG/2ML IJ SOLN
INTRAMUSCULAR | Status: AC
  Filled 2021-04-20: qty 2

## 2021-04-20 MED ORDER — ONDANSETRON HCL 4 MG/2ML IJ SOLN
INTRAMUSCULAR | Status: DC | PRN
  Administered 2021-04-20: 4 mg via INTRAVENOUS

## 2021-04-20 MED ORDER — HEPARIN SODIUM (PORCINE) 5000 UNIT/ML IJ SOLN
INTRAMUSCULAR | Status: AC
  Filled 2021-04-20: qty 1

## 2021-04-20 MED ORDER — ACETAMINOPHEN 500 MG PO TABS
1000.0000 mg | ORAL_TABLET | ORAL | Status: AC
  Administered 2021-04-20: 1000 mg via ORAL

## 2021-04-20 MED ORDER — BUPIVACAINE HCL 0.25 % IJ SOLN
INTRAMUSCULAR | Status: AC
  Filled 2021-04-20: qty 1

## 2021-04-20 MED ORDER — CELECOXIB 200 MG PO CAPS
200.0000 mg | ORAL_CAPSULE | ORAL | Status: AC
  Administered 2021-04-20: 200 mg via ORAL

## 2021-04-20 MED ORDER — GABAPENTIN 300 MG PO CAPS
300.0000 mg | ORAL_CAPSULE | ORAL | Status: AC
  Administered 2021-04-20: 300 mg via ORAL

## 2021-04-20 MED ORDER — CEFAZOLIN SODIUM-DEXTROSE 2-4 GM/100ML-% IV SOLN
INTRAVENOUS | Status: AC
  Filled 2021-04-20: qty 100

## 2021-04-20 MED ORDER — DEXAMETHASONE SODIUM PHOSPHATE 4 MG/ML IJ SOLN
4.0000 mg | INTRAMUSCULAR | Status: AC
  Administered 2021-04-20: 10 mg via INTRAVENOUS

## 2021-04-20 MED ORDER — EPHEDRINE SULFATE-NACL 50-0.9 MG/10ML-% IV SOSY
PREFILLED_SYRINGE | INTRAVENOUS | Status: DC | PRN
  Administered 2021-04-20: 7.5 mg via INTRAVENOUS
  Administered 2021-04-20 (×2): 5 mg via INTRAVENOUS
  Administered 2021-04-20: 7.5 mg via INTRAVENOUS

## 2021-04-20 MED ORDER — HEPARIN SODIUM (PORCINE) 5000 UNIT/ML IJ SOLN
5000.0000 [IU] | INTRAMUSCULAR | Status: AC
  Administered 2021-04-20: 5000 [IU] via SUBCUTANEOUS

## 2021-04-20 MED ORDER — LIDOCAINE 2% (20 MG/ML) 5 ML SYRINGE
INTRAMUSCULAR | Status: DC | PRN
  Administered 2021-04-20: 60 mg via INTRAVENOUS

## 2021-04-20 SURGICAL SUPPLY — 73 items
APPLICATOR SURGIFLO ENDO (HEMOSTASIS) IMPLANT
BAG COUNTER SPONGE SURGICOUNT (BAG) IMPLANT
BAG LAPAROSCOPIC 12 15 PORT 16 (BASKET) IMPLANT
BAG RETRIEVAL 12/15 (BASKET)
BAG RETRIEVAL 12/15MM (BASKET)
BAG SURGICOUNT SPONGE COUNTING (BAG)
BENZOIN TINCTURE PRP APPL 2/3 (GAUZE/BANDAGES/DRESSINGS) ×3 IMPLANT
BLADE SURG SZ10 CARB STEEL (BLADE) IMPLANT
CLOSURE WOUND 1/2 X4 (GAUZE/BANDAGES/DRESSINGS) ×1
COVER BACK TABLE 60X90IN (DRAPES) ×3 IMPLANT
COVER TIP SHEARS 8 DVNC (MISCELLANEOUS) ×1 IMPLANT
COVER TIP SHEARS 8MM DA VINCI (MISCELLANEOUS) ×2
DERMABOND ADVANCED (GAUZE/BANDAGES/DRESSINGS)
DERMABOND ADVANCED .7 DNX12 (GAUZE/BANDAGES/DRESSINGS) IMPLANT
DRAPE ARM DVNC X/XI (DISPOSABLE) ×4 IMPLANT
DRAPE COLUMN DVNC XI (DISPOSABLE) ×1 IMPLANT
DRAPE DA VINCI XI ARM (DISPOSABLE) ×8
DRAPE DA VINCI XI COLUMN (DISPOSABLE) ×2
DRAPE SHEET LG 3/4 BI-LAMINATE (DRAPES) ×3 IMPLANT
DRAPE SURG IRRIG POUCH 19X23 (DRAPES) ×3 IMPLANT
DRSG OPSITE POSTOP 4X6 (GAUZE/BANDAGES/DRESSINGS) IMPLANT
DRSG OPSITE POSTOP 4X8 (GAUZE/BANDAGES/DRESSINGS) IMPLANT
DRSG TEGADERM 2-3/8X2-3/4 SM (GAUZE/BANDAGES/DRESSINGS) ×3 IMPLANT
ELECT PENCIL ROCKER SW 15FT (MISCELLANEOUS) IMPLANT
ELECT REM PT RETURN 15FT ADLT (MISCELLANEOUS) ×3 IMPLANT
GAUZE 4X4 16PLY ~~LOC~~+RFID DBL (SPONGE) ×3 IMPLANT
GAUZE SPONGE 2X2 8PLY STRL LF (GAUZE/BANDAGES/DRESSINGS) ×1 IMPLANT
GLOVE SURG ENC MOIS LTX SZ6 (GLOVE) ×12 IMPLANT
GLOVE SURG ENC MOIS LTX SZ6.5 (GLOVE) ×6 IMPLANT
GOWN STRL REUS W/ TWL LRG LVL3 (GOWN DISPOSABLE) ×4 IMPLANT
GOWN STRL REUS W/TWL LRG LVL3 (GOWN DISPOSABLE) ×8
HOLDER FOLEY CATH W/STRAP (MISCELLANEOUS) ×3 IMPLANT
IRRIG SUCT STRYKERFLOW 2 WTIP (MISCELLANEOUS) ×3
IRRIGATION SUCT STRKRFLW 2 WTP (MISCELLANEOUS) ×1 IMPLANT
KIT TURNOVER KIT A (KITS) IMPLANT
MANIPULATOR ADVINCU DEL 3.0 PL (MISCELLANEOUS) IMPLANT
MANIPULATOR UTERINE 4.5 ZUMI (MISCELLANEOUS) ×3 IMPLANT
NEEDLE SPNL 18GX3.5 QUINCKE PK (NEEDLE) IMPLANT
OBTURATOR OPTICAL STANDARD 8MM (TROCAR) ×2
OBTURATOR OPTICAL STND 8 DVNC (TROCAR) ×1
OBTURATOR OPTICALSTD 8 DVNC (TROCAR) ×1 IMPLANT
PACK ROBOT GYN CUSTOM WL (TRAY / TRAY PROCEDURE) ×3 IMPLANT
PAD POSITIONING PINK XL (MISCELLANEOUS) ×3 IMPLANT
PORT ACCESS TROCAR AIRSEAL 12 (TROCAR) ×1 IMPLANT
PORT ACCESS TROCAR AIRSEAL 5M (TROCAR) ×2
POUCH SPECIMEN RETRIEVAL 10MM (ENDOMECHANICALS) IMPLANT
SCRUB EXIDINE 4% CHG 4OZ (MISCELLANEOUS) ×3 IMPLANT
SEAL CANN UNIV 5-8 DVNC XI (MISCELLANEOUS) ×4 IMPLANT
SEAL XI 5MM-8MM UNIVERSAL (MISCELLANEOUS) ×8
SET TRI-LUMEN FLTR TB AIRSEAL (TUBING) ×3 IMPLANT
SOL ANTI FOG 6CC (MISCELLANEOUS) ×1 IMPLANT
SOLUTION ANTI FOG 6CC (MISCELLANEOUS) ×2
SPONGE GAUZE 2X2 STER 10/PKG (GAUZE/BANDAGES/DRESSINGS) ×2
SPONGE T-LAP 18X18 ~~LOC~~+RFID (SPONGE) IMPLANT
STRIP CLOSURE SKIN 1/2X4 (GAUZE/BANDAGES/DRESSINGS) ×2 IMPLANT
SURGIFLO W/THROMBIN 8M KIT (HEMOSTASIS) IMPLANT
SUT MNCRL AB 4-0 PS2 18 (SUTURE) IMPLANT
SUT PDS AB 1 TP1 96 (SUTURE) IMPLANT
SUT VIC AB 0 CT1 27 (SUTURE)
SUT VIC AB 0 CT1 27XBRD ANTBC (SUTURE) IMPLANT
SUT VIC AB 2-0 CT1 27 (SUTURE)
SUT VIC AB 2-0 CT1 TAPERPNT 27 (SUTURE) IMPLANT
SUT VIC AB 2-0 SH 27 (SUTURE) ×2
SUT VIC AB 2-0 SH 27XBRD (SUTURE) ×1 IMPLANT
SUT VIC AB 4-0 PS2 18 (SUTURE) ×6 IMPLANT
SYR 10ML LL (SYRINGE) IMPLANT
TOWEL OR NON WOVEN STRL DISP B (DISPOSABLE) ×3 IMPLANT
TRAP SPECIMEN MUCUS 40CC (MISCELLANEOUS) ×3 IMPLANT
TRAY FOLEY MTR SLVR 16FR STAT (SET/KITS/TRAYS/PACK) ×3 IMPLANT
TROCAR XCEL NON-BLD 5MMX100MML (ENDOMECHANICALS) IMPLANT
UNDERPAD 30X36 HEAVY ABSORB (UNDERPADS AND DIAPERS) ×6 IMPLANT
WATER STERILE IRR 1000ML POUR (IV SOLUTION) ×3 IMPLANT
YANKAUER SUCT BULB TIP 10FT TU (MISCELLANEOUS) IMPLANT

## 2021-04-20 NOTE — Discharge Instructions (Signed)

## 2021-04-20 NOTE — Anesthesia Postprocedure Evaluation (Signed)
Anesthesia Post Note  Patient: Casey Bauer  Procedure(s) Performed: XI ROBOTIC ASSISTED TOTAL HYSTERECTOMY WITH LEFT SALPINGO OOPHORECTOMY     Patient location during evaluation: PACU Anesthesia Type: General Level of consciousness: awake and alert Pain management: pain level controlled Vital Signs Assessment: post-procedure vital signs reviewed and stable Respiratory status: spontaneous breathing, nonlabored ventilation and respiratory function stable Cardiovascular status: blood pressure returned to baseline and stable Postop Assessment: no apparent nausea or vomiting Anesthetic complications: no   No notable events documented.  Last Vitals:  Vitals:   04/20/21 1230 04/20/21 1240  BP:  104/66  Pulse: 83 85  Resp: 18 17  Temp:  (!) 36.4 C  SpO2: 96% 96%    Last Pain:  Vitals:   04/20/21 1240  TempSrc:   PainSc: 0-No pain                 Catalina Gravel

## 2021-04-20 NOTE — Interval H&P Note (Signed)
History and Physical Interval Note:  04/20/2021 7:17 AM  Casey Bauer  has presented today for surgery, with the diagnosis of ATYPICAL CELLS FOUND IN WASHINGS.  The various methods of treatment have been discussed with the patient and family. After consideration of risks, benefits and other options for treatment, the patient has consented to  Procedure(s): XI ROBOTIC ASSISTED TOTAL HYSTERECTOMY WITH LEFT SALPINGO OOPHORECTOMY (N/A) POSSIBLE LAPAROTOMY (N/A) as a surgical intervention.  The patient's history has been reviewed, patient examined, no change in status, stable for surgery.  I have reviewed the patient's chart and labs.  Questions were answered to the patient's satisfaction.     Lafonda Mosses

## 2021-04-20 NOTE — Op Note (Signed)
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   Anesthesia: GET  Urine Output: 150cc  Operative Findings: On EUA, small mobile uterus, no adnexal masses. On intra-abdominal entry, normal upper abdominal survey including liver edge, diaphragm, and stomach. Normal appearing omentum, small and large bowel. Right adnexa surgically absent. Left ovary normal in appearance. Left tube elongated, fimbriae adherent to the posterior cul de sac below the cervix. Uterus 6 cm and normal in appearance. No ascites, no pelvic findings.   Estimated Blood Loss:  75cc      Total IV Fluids: see I&O flowsheet         Specimens: uterus, cervix, left tube and ovary, pelvic washings         Complications:  None apparent; patient tolerated the procedure well.         Disposition: PACU - hemodynamically stable.  Procedure Details  The patient was seen in the Holding Room. The risks, benefits, complications, treatment options, and expected outcomes were discussed with the patient.  The patient concurred with the proposed plan, giving informed consent.  The site of surgery properly noted/marked. The patient was identified as Casey Bauer and the procedure verified as a Robotic-assisted hysterectomy with USO, possible staging.   After induction of anesthesia, the patient was draped and prepped in the usual sterile manner. Patient was placed in supine position after anesthesia and  draped and prepped in the usual sterile manner as follows: Her arms were tucked to her side with all appropriate precautions.  The shoulders were stabilized with padded shoulder blocks applied to the acromium processes.  The patient was placed in the semi-lithotomy position in [place].  The perineum and vagina were prepped with CholoraPrep. The patient was draped after the CholoraPrep had been allowed to dry for 3 minutes.  A Time Out was held and the above information confirmed.  The urethra was prepped with Betadine. Foley catheter was placed.  A sterile speculum was placed in the vagina.  The cervix was grasped with a single-tooth tenaculum. The cervix was dilated with Kennon Rounds dilators.  The ZUMI uterine manipulator with a medium colpotomizer ring was placed without difficulty.  A pneum occluder balloon was placed over the manipulator.  OG tube placement was confirmed and to suction.   Next, a 10 mm skin incision was made 1 cm below the subcostal margin in the midclavicular line.  The 5 mm Optiview port and scope was used for direct entry.  Opening pressure was under 10 mm CO2.  The abdomen was insufflated and the findings were noted as above.   At this point and all points during the procedure, the patient's intra-abdominal pressure did not exceed 15 mmHg. Next, an 8 mm skin incision was made inferior to the umbilicus and a right and left port were placed about 8 cm lateral to the robot port on the right and left side.  The 5 mm assist trocar was exchanged for a 10-12 mm port. All ports were placed under direct visualization.  The patient was placed in steep Trendelenburg.  Bowel was already noted to  be in the upper abdomen.  The robot was docked in the normal manner.  The right and left peritoneum were opened parallel to the IP ligament to open the retroperitoneal spaces bilaterally. The round ligaments were transected. The ureter was noted to be on the medial leaf of the broad ligament.  The  peritoneum above the ureter was incised on the left and stretched and the infundibulopelvic ligament was skeletonized, cauterized and cut.    The posterior peritoneum was taken down to the level of the KOH ring.  The anterior peritoneum was also taken down.  The bladder flap was created to the level of the KOH ring.  The uterine artery on the right side was skeletonized, cauterized and cut in the normal manner.  A similar procedure was performed on the left.  The colpotomy was made and the uterus, cervix, bilateral ovaries and tubes were amputated and delivered through the vagina.  Pedicles were inspected and excellent hemostasis was achieved.    The colpotomy at the vaginal cuff was closed with Vicryl on a CT1 needle in running manner.  Small area of bleeding noted along right bladder peritoneum. Hemostasis was achieved using a figure of eight 2-0 Vicryl. Irrigation was used and excellent hemostasis was achieved.  At this point in the procedure was completed.  Robotic instruments were removed under direct visulaization.  The robot was undocked. The fascia at the 10-12 mm port was closed with 0 Vicryl on a UR-5 needle.  The subcuticular tissue was closed with 4-0 Vicryl and the skin was closed with 4-0 Monocryl in a subcuticular manner.  Benzoin and steri-strips were applied. Small incisions were covered with a band-aid. Larger incision was covered with 2 x 2 and Tegaderm.   The vagina was swabbed with small bleeding noted from posterior vaginal wall. Monopolar electrocautery was used to achieve hemostasis. The vagina was swabbed again with minimal bleeding noted.   All sponge, lap and needle counts were correct x  3.   The patient was transferred to the recovery room in stable condition.  Jeral Pinch, MD

## 2021-04-20 NOTE — Transfer of Care (Signed)
Immediate Anesthesia Transfer of Care Note  Patient: Casey Bauer  Procedure(s) Performed: XI ROBOTIC ASSISTED TOTAL HYSTERECTOMY WITH LEFT SALPINGO OOPHORECTOMY  Patient Location: PACU  Anesthesia Type:General  Level of Consciousness: drowsy  Airway & Oxygen Therapy: Patient Spontanous Breathing and Patient connected to face mask oxygen  Post-op Assessment: Report given to RN and Post -op Vital signs reviewed and stable  Post vital signs: Reviewed and stable  Last Vitals:  Vitals Value Taken Time  BP 134/78 04/20/21 0940  Temp    Pulse 79 04/20/21 0940  Resp 19 04/20/21 0940  SpO2 100 % 04/20/21 0940  Vitals shown include unvalidated device data.  Last Pain:  Vitals:   04/20/21 0533  TempSrc: Oral         Complications: No notable events documented.

## 2021-04-20 NOTE — Anesthesia Procedure Notes (Addendum)
Procedure Name: Intubation Date/Time: 04/20/2021 7:33 AM Performed by: Catalina Gravel, MD Pre-anesthesia Checklist: Patient identified, Emergency Drugs available, Suction available and Patient being monitored Patient Re-evaluated:Patient Re-evaluated prior to induction Oxygen Delivery Method: Circle system utilized Preoxygenation: Pre-oxygenation with 100% oxygen Induction Type: IV induction Ventilation: Mask ventilation without difficulty Laryngoscope Size: Mac and 4 Grade View: Grade II Tube type: Oral Tube size: 7.0 mm Number of attempts: 1 Airway Equipment and Method: Stylet and Oral airway Placement Confirmation: ETT inserted through vocal cords under direct vision, positive ETCO2 and breath sounds checked- equal and bilateral Secured at: 22 cm Tube secured with: Tape Dental Injury: Teeth and Oropharynx as per pre-operative assessment

## 2021-04-20 NOTE — H&P (Signed)
Gynecologic Oncology History and Physical  04/20/21  Treatment History: 02/10/21: Robotic-assisted laparoscopic right salpingo-oophorectomy. 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  03/12/20: CT A/P with no metastatic disease. Two sub-cm foci in pancreas, MRI recommended.  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  Interval History: Patient presents today to discuss additional treatment.  She underwent robotic USO last September with final pathology revealing benign ovarian mass but atypical cells seen with psammoma bodies and the peritoneal washings.  Given this, patient was offered completion surgery to rule out preinvasive or invasive lesion and other GYN organs versus close follow-up with ultrasound and CA-125 every 6 months for 5 years.  Subsequently, the patient had normal colonoscopy and mammogram.  CT scan after surgery showed no evidence of metastatic disease.  There were 2 small foci in the pancreas and MRI was recommended with findings as noted above.   Patient notes overall doing well since her last visit.  She denies any abdominal or pelvic pain.  She reports regular bowel and bladder function.  She denies any vaginal bleeding.   Colonoscopy 03/2020: normal no biopsies Last mammogram: 05/2020, BIRADS 1.  Past Medical/Surgical History: Past Medical History:  Diagnosis Date   Allergy    Anemia    Complication of anesthesia    slow to wake up   GERD (gastroesophageal reflux disease)    History of kidney stones    IBS (irritable bowel syndrome)    Osteopenia    Osteoporosis    STD (female)    Thyroid tumor, benign     Past Surgical History:  Procedure Laterality Date   ARM SURGERY Right    fracture   CHOLECYSTECTOMY N/A 10/12/2018   Procedure: LAPAROSCOPIC CHOLECYSTECTOMY;  Surgeon: Rolm Bookbinder, MD;  Location: [facility];   Service: General;  Laterality: N/A;  TAP BLOCK   COLONOSCOPY     ROBOTIC ASSISTED SALPINGO OOPHERECTOMY Right 02/11/2020   Procedure: XI ROBOTIC ASSISTED RIGHT SALPINGO OOPHORECTOMY;  Surgeon: Everitt Amber, MD;  Location: [facility];  Service: Gynecology;  Laterality: Right;   UPPER GI ENDOSCOPY     WISDOM TOOTH EXTRACTION      Family History  Problem Relation Age of Onset   Gallbladder disease Mother    Cancer Mother        lung   COPD Father    Asthma Father    Heart failure Father    Gallbladder disease Sister    Bone cancer Paternal Grandmother    Gallbladder disease Sister    Colon polyps Neg Hx    Colon cancer Neg Hx    Esophageal cancer Neg Hx    Stomach cancer Neg Hx     Social History   Socioeconomic History   Marital status: Married    Spouse name: Not on file   Number of children: 1   Years of education: Not on file   Highest education level: Not on file  Occupational History   Occupation: home maker  Tobacco Use   Smoking status: Never   Smokeless tobacco: Never  Vaping Use   Vaping Use: Never used  Substance and Sexual Activity   Alcohol use: Yes    Comment: occasional wine   Drug use: Never   Sexual activity: Not on file  Other Topics Concern   Not on file  Social History Narrative   Not on file   Social Determinants of  Health   Financial Resource Strain: Not on file  Food Insecurity: Not on file  Transportation Needs: Not on file  Physical Activity: Not on file  Stress: Not on file  Social Connections: Not on file    Current Medications:  Current Facility-Administered Medications:    ceFAZolin (ANCEF) IVPB 2g/100 mL premix, 2 g, Intravenous, On Call to OR, Cross, Melissa D, NP   dexamethasone (DECADRON) injection 4 mg, 4 mg, Intravenous, On Call to OR, Cross, Melissa D, NP   heparin injection 5,000 Units, 5,000 Units, Subcutaneous, 120 min pre-op, Cross, Melissa D, NP   lactated ringers infusion, , Intravenous, Continuous, Catalina Gravel, MD, Last Rate: 10 mL/hr at 04/20/21 0702, Continued from Pre-op at 04/20/21 0702   scopolamine (TRANSDERM-SCOP) 1 MG/3DAYS 1.5 mg, 1 patch, Transdermal, On Call to OR, Cross, Melissa D, NP, 1.5 mg at 04/20/21 5427  Review of Systems: Denies appetite changes, fevers, chills, fatigue, unexplained weight changes. Denies hearing loss, neck lumps or masses, mouth sores, ringing in ears or voice changes. Denies cough or wheezing.  Denies shortness of breath. Denies chest pain or palpitations. Denies leg swelling. Denies abdominal distention, pain, blood in stools, constipation, diarrhea, nausea, vomiting, or early satiety. Denies pain with intercourse, dysuria, frequency, hematuria or incontinence. Denies hot flashes, pelvic pain, vaginal bleeding or vaginal discharge.   Denies joint pain, back pain or muscle pain/cramps. Denies itching, rash, or wounds. Denies dizziness, headaches, numbness or seizures. Denies swollen lymph nodes or glands, denies easy bruising or bleeding. Denies anxiety, depression, confusion, or decreased concentration.  Physical Exam: BP 117/78   Pulse 72   Temp 98.6 F (37 C) (Oral)   Resp 16   SpO2 99%  General: Alert, oriented, no acute distress. HEENT: Normocephalic, atraumatic, sclera anicteric. Chest: Clear to auscultation bilaterally.  No wheezes or rhonchi. Cardiovascular: Regular rate and rhythm, no murmurs. Abdomen: soft, nontender.  Normoactive bowel sounds.  No masses or hepatosplenomegaly appreciated.  Well-healed incisions, small keloid at infraumbilical incision. Extremities: Grossly normal range of motion.  Warm, well perfused.  No edema bilaterally. Skin: No rashes or lesions noted.  Laboratory & Radiologic Studies: CBC    Component Value Date/Time   WBC 5.6 04/08/2021 1016   RBC 4.54 04/08/2021 1016   HGB 14.2 04/08/2021 1016   HCT 42.6 04/08/2021 1016   PLT 280 04/08/2021 1016   MCV 93.8 04/08/2021 1016   MCH 31.3 04/08/2021 1016    MCHC 33.3 04/08/2021 1016   RDW 11.9 04/08/2021 1016   LYMPHSABS 1.4 10/12/2018 0746   MONOABS 0.3 10/12/2018 0746   EOSABS 0.1 10/12/2018 0746   BASOSABS 0.0 10/12/2018 0746   BMP Latest Ref Rng & Units 04/08/2021 02/10/2020 10/12/2018  Glucose 70 - 99 mg/dL 98 96 129(H)  BUN 6 - 20 mg/dL 18 18 17   Creatinine 0.44 - 1.00 mg/dL 0.66 0.60 0.75  Sodium 135 - 145 mmol/L 139 139 142  Potassium 3.5 - 5.1 mmol/L 3.8 3.9 3.8  Chloride 98 - 111 mmol/L 107 106 107  CO2 22 - 32 mmol/L 24 25 21(L)  Calcium 8.9 - 10.3 mg/dL 9.1 8.7(L) 8.9   Assessment & Plan: Casey Bauer is a 60 y.o. 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    Presents today for definitive surgery in the setting of atypical cells seen and peritoneal washings at the start of her USO last year.  She has had colonoscopy and mammogram within the  last year that have both been normal.    Plan is robotic total hysterectomy and removal of remaining tube and ovary.  We will plan to get peritoneal washings again at the start of surgery and do a full pelvic and abdominal survey.  Unless there is something abnormal at the time of surgery, we will not plan to send anything for frozen section.    Jeral Pinch, MD  Division of Gynecologic Oncology  Department of Obstetrics and Gynecology  [facility]

## 2021-04-21 ENCOUNTER — Encounter (HOSPITAL_COMMUNITY): Payer: Self-pay | Admitting: Gynecologic Oncology

## 2021-04-21 ENCOUNTER — Telehealth: Payer: Self-pay

## 2021-04-21 LAB — CYTOLOGY - NON PAP

## 2021-04-21 NOTE — Telephone Encounter (Signed)
Spoke with Ms. Obando this morning. She states she is eating, drinking and urinating well. She has not had a BM yet but is passing gas. She is taking senokot as prescribed and encouraged her to drink plenty of water. She reports temp of 99.5. instructed patient to monitor her temp and notify our office if it increases. Dressings covering incisions. Instructed patient to remove gauze and tegaderm 24 hours after surgery. Steri strips will remain in place for 1 week. Patient rates her pain as 6-7/10.  Patient only taking oxycodone. Advised her to alternate taking ibuprofen and tylenol every 6 hours being careful not to exceed 4,000 mg of tylenol in a 24 hour period. Patient verbalized understanding.  Instructed to call office with any fever, chills, purulent drainage, uncontrolled pain or any other questions or concerns. Patient verbalizes understanding.   Pt aware of post op appointments as well as the office number [phone] and after hours number [phone] to call if she has any questions or concerns

## 2021-04-22 ENCOUNTER — Telehealth: Payer: Self-pay

## 2021-04-22 LAB — SURGICAL PATHOLOGY

## 2021-04-22 NOTE — Telephone Encounter (Signed)
Following up with Casey Bauer. Patient reports feeling better today. She has been alternating tylenol and ibuprofen. She rates her pain as 0/10 when resting and 2-3/10 when ambulating.   Reviewed surgical pathology results with patient. Per Dr. Berline Lopes there is no cancer in the uterus, cervix or remaining tube and ovary. Fluid is negative for any atypical or cancer cells. Patient verbalized understanding and happy with news. Instructed to call with any needs.

## 2021-04-27 ENCOUNTER — Telehealth: Payer: 59 | Admitting: Gynecologic Oncology

## 2021-05-13 ENCOUNTER — Encounter: Payer: Self-pay | Admitting: Gynecologic Oncology

## 2021-05-14 ENCOUNTER — Encounter: Payer: Self-pay | Admitting: Gynecologic Oncology

## 2021-05-14 ENCOUNTER — Inpatient Hospital Stay: Payer: 59 | Attending: Gynecologic Oncology | Admitting: Gynecologic Oncology

## 2021-05-14 ENCOUNTER — Other Ambulatory Visit: Payer: Self-pay

## 2021-05-14 VITALS — BP 128/74 | HR 93 | Temp 97.6°F | Resp 16 | Ht 61.0 in | Wt 108.2 lb

## 2021-05-14 DIAGNOSIS — R188 Other ascites: Secondary | ICD-10-CM

## 2021-05-14 DIAGNOSIS — Z7189 Other specified counseling: Secondary | ICD-10-CM

## 2021-05-14 DIAGNOSIS — K8689 Other specified diseases of pancreas: Secondary | ICD-10-CM

## 2021-05-14 DIAGNOSIS — Z90710 Acquired absence of both cervix and uterus: Secondary | ICD-10-CM

## 2021-05-14 DIAGNOSIS — Z90722 Acquired absence of ovaries, bilateral: Secondary | ICD-10-CM

## 2021-05-14 NOTE — Patient Instructions (Addendum)
Was good to see you today.  You are healing very well from surgery!  Remember, no heavy lifting until 6 weeks after surgery and nothing in the vagina until at least 8 weeks.  Please not hesitate to reach out if you need anything in the future.  From my gynecologic standpoint, I would recommend continuing to follow with your OB/GYN and getting mammograms.  If none of your other doctors feel comfortable ordering a follow-up MRI to look at your pancreas a year from the last time we did this, please call my clinic next year and I am happy to order this.  You will be due for this again in November 2023.

## 2021-05-14 NOTE — Progress Notes (Signed)
Gynecologic Oncology Return Clinic Visit  05/14/21  Reason for Visit: Follow-up after surgery  Treatment History: 02/10/21: Robotic-assisted laparoscopic right salpingo-oophorectomy. 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  03/12/20: CT A/P with no metastatic disease. Two sub-cm foci in pancreas, MRI recommended.  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  No high risk MRI findings. 04/20/2021: Robotic assisted total laparoscopic hysterectomy with left salpingo-oophorectomy.  Interval History: Doing well after surgery.  Denies any pain.  No longer taking any medications.  Reports regular bowel and bladder function.  Had very light spotting for about a week after surgery, denies any further vaginal bleeding or discharge.  Reports good appetite without nausea or emesis.  Past Medical/Surgical History: Past Medical History:  Diagnosis Date   Allergy    Anemia    Complication of anesthesia    slow to wake up   GERD (gastroesophageal reflux disease)    History of kidney stones    IBS (irritable bowel syndrome)    Osteopenia    Osteoporosis    STD (female)    Thyroid tumor, benign     Past Surgical History:  Procedure Laterality Date   ARM SURGERY Right    fracture   CHOLECYSTECTOMY N/A 10/12/2018   Procedure: LAPAROSCOPIC CHOLECYSTECTOMY;  Surgeon: Rolm Bookbinder, MD;  Location: [place];  Service: General;  Laterality: N/A;  TAP BLOCK   COLONOSCOPY     ROBOTIC ASSISTED SALPINGO OOPHERECTOMY Right 02/11/2020   Procedure: XI ROBOTIC ASSISTED RIGHT SALPINGO OOPHORECTOMY;  Surgeon: Everitt Amber, MD;  Location: [facility];  Service: Gynecology;  Laterality:  Right;   ROBOTIC ASSISTED TOTAL HYSTERECTOMY WITH BILATERAL SALPINGO OOPHERECTOMY N/A 04/20/2021   Procedure: XI ROBOTIC ASSISTED TOTAL HYSTERECTOMY WITH LEFT SALPINGO OOPHORECTOMY;  Surgeon: Lafonda Mosses, MD;  Location: [facility];  Service: Gynecology;  Laterality: N/A;   UPPER GI ENDOSCOPY     WISDOM TOOTH EXTRACTION      Family History  Problem Relation Age of Onset   Gallbladder disease Mother    Cancer Mother        lung   COPD Father    Asthma Father    Heart failure Father    Gallbladder disease Sister    Bone cancer Paternal Grandmother    Gallbladder disease Sister    Colon polyps Neg Hx    Colon cancer Neg Hx    Esophageal cancer Neg Hx    Stomach cancer Neg Hx     Social History   Socioeconomic History   Marital status: Married    Spouse name: Not on file   Number of children: 1   Years of education: Not on file   Highest education level: Not on file  Occupational History   Occupation: home maker  Tobacco Use   Smoking status: Never   Smokeless tobacco: Never  Vaping Use   Vaping Use: Never used  Substance and Sexual Activity   Alcohol use: Yes    Comment: occasional wine   Drug use: Never   Sexual activity: Not on file  Other Topics Concern   Not on file  Social History Narrative   Not on file   Social Determinants of Health   Financial Resource Strain: Not on file  Food  Insecurity: Not on file  Transportation Needs: Not on file  Physical Activity: Not on file  Stress: Not on file  Social Connections: Not on file    Current Medications:  Current Outpatient Medications:    calcium carbonate (OSCAL) 1500 (600 Ca) MG TABS tablet, Take 600 mg of elemental calcium by mouth daily with breakfast., Disp: , Rfl:   Review of Systems: Denies appetite changes, fevers, chills, fatigue, unexplained weight changes. Denies hearing loss, neck lumps or masses, mouth sores, ringing in ears or voice changes. Denies cough or wheezing.  Denies shortness of  breath. Denies chest pain or palpitations. Denies leg swelling. Denies abdominal distention, pain, blood in stools, constipation, diarrhea, nausea, vomiting, or early satiety. Denies pain with intercourse, dysuria, frequency, hematuria or incontinence. Denies hot flashes, pelvic pain, vaginal bleeding or vaginal discharge.   Denies joint pain, back pain or muscle pain/cramps. Denies itching, rash, or wounds. Denies dizziness, headaches, numbness or seizures. Denies swollen lymph nodes or glands, denies easy bruising or bleeding. Denies anxiety, depression, confusion, or decreased concentration.  Physical Exam: BP 128/74 (BP Location: Left Arm, Patient Position: Sitting)    Pulse 93    Temp 97.6 F (36.4 C) (Tympanic)    Resp 16    Ht 5\' 1"  (1.549 m)    Wt 108 lb 3.2 oz (49.1 kg)    SpO2 98%    BMI 20.44 kg/m  General: Alert, oriented, no acute distress. HEENT: Normocephalic, atraumatic, sclera anicteric. Chest: Unlabored breathing on room air. Abdomen: soft, nontender.  Normoactive bowel sounds.  No masses or hepatosplenomegaly appreciated.  Well-healed incisions.  Remaining mattress sutures removed today. Extremities: Grossly normal range of motion.  Warm, well perfused.  No edema bilaterally. Skin: No rashes or lesions noted. GU: Normal appearing external genitalia without erythema, excoriation, or lesions.  Speculum exam reveals no blood or discharge within the vaginal vault, cuff intact with suture still visible.  Bimanual exam reveals cuff intact, no tenderness or fluctuance.  Laboratory & Radiologic Studies: A. UTERUS, CERVIX, LEFT FALLOPIAN TUBE AND OVARY:  - Uterus benign inactive endometrium  - Benign unremarkable cervix  - Benign left fallopian tube  - Ovary is not identified  - No evidence of malignancy  FINAL MICROSCOPIC DIAGNOSIS:  - Reactive mesothelial cells present   Assessment & Plan: Casey Bauer is a 60 y.o. 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  Patient is overall doing well and healing well from surgery.  Postoperative expectations as well as restrictions were reviewed with her today.  Discussed final pathology and patient was given a paper copy of her pathology and cytology report.  No evidence of atypical cells in cytology from her recent surgery.  Fallopian tube and ovary on the left were normal as were her other GYN structures removed.  Given no definitive findings of GYN borderline tumor or malignancy, I do not think that she requires additional GYN follow-up from an oncologic standpoint.  I encouraged her to follow-up with her OB/GYN for routine women's health needs.  With regard to her pancreatic head lesion, radiology recommends repeat imaging in a year.  She will be due for this in November 2023.  I have encouraged her to reach out if none of her other providers, such as her PCP, feel comfortable ordering this next year.  I would be happy to order this  for her.  28 minutes of total time was spent for this patient encounter, including preparation, face-to-face counseling with the patient and coordination of care, and documentation of the encounter.  Jeral Pinch, MD  Division of Gynecologic Oncology  Department of Obstetrics and Gynecology  [facility]

## 2022-10-14 ENCOUNTER — Other Ambulatory Visit: Payer: Self-pay | Admitting: Urology

## 2022-10-24 ENCOUNTER — Encounter (HOSPITAL_COMMUNITY): Payer: Self-pay | Admitting: *Deleted

## 2022-10-24 NOTE — Progress Notes (Signed)
Spoke w/ via phone for pre-op interview--- Casey Bauer needs dos---- Casey Bauer)              Bauer results------ COVID test -----patient states asymptomatic no test needed Arrive at -------0530 NPO after MN NO Solid Food.   Med rec completed Medications to take morning of surgery -----NONE Diabetic medication ----- Patient instructed no nail polish to be worn day of surgery Patient instructed to bring photo id and insurance card day of surgery Patient aware to have Driver (ride ) / caregiver  Husband Quentin Ore  for 24 hours after surgery  Patient Special Instructions ----- Pre-Op special Instructions ----- Patient verbalized understanding of instructions that were given at this phone interview. Patient denies shortness of breath, chest pain, fever, cough at this phone interview.

## 2022-11-10 NOTE — Anesthesia Preprocedure Evaluation (Addendum)
Anesthesia Evaluation  Patient identified by MRN, date of birth, ID band Patient awake    Reviewed: Allergy & Precautions, NPO status , Patient's Chart, lab work & pertinent test results  History of Anesthesia Complications (+) PROLONGED EMERGENCE and history of anesthetic complications  Airway Mallampati: II  TM Distance: >3 FB Neck ROM: Full    Dental  (+) Dental Advisory Given, Teeth Intact   Pulmonary neg pulmonary ROS   Pulmonary exam normal        Cardiovascular negative cardio ROS Normal cardiovascular exam     Neuro/Psych negative neurological ROS  negative psych ROS   GI/Hepatic Neg liver ROS,GERD  Controlled,, IBS    Endo/Other  negative endocrine ROS    Renal/GU negative Renal ROS    Bladder tumor     Musculoskeletal negative musculoskeletal ROS (+)    Abdominal   Peds  Hematology negative hematology ROS (+)   Anesthesia Other Findings   Reproductive/Obstetrics                             Anesthesia Physical Anesthesia Plan  ASA: 2  Anesthesia Plan: General   Post-op Pain Management: Tylenol PO (pre-op)*   Induction: Intravenous  PONV Risk Score and Plan: 3 and Treatment may vary due to age or medical condition, Ondansetron, Dexamethasone and Midazolam  Airway Management Planned: Oral ETT  Additional Equipment: None  Intra-op Plan:   Post-operative Plan: Extubation in OR  Informed Consent: I have reviewed the patients History and Physical, chart, labs and discussed the procedure including the risks, benefits and alternatives for the proposed anesthesia with the patient or authorized representative who has indicated his/her understanding and acceptance.     Dental advisory given  Plan Discussed with: CRNA and Anesthesiologist  Anesthesia Plan Comments:        Anesthesia Quick Evaluation

## 2022-11-11 ENCOUNTER — Ambulatory Visit (HOSPITAL_BASED_OUTPATIENT_CLINIC_OR_DEPARTMENT_OTHER): Payer: 59 | Admitting: Anesthesiology

## 2022-11-11 ENCOUNTER — Other Ambulatory Visit: Payer: Self-pay

## 2022-11-11 ENCOUNTER — Ambulatory Visit (HOSPITAL_BASED_OUTPATIENT_CLINIC_OR_DEPARTMENT_OTHER)
Admission: RE | Admit: 2022-11-11 | Discharge: 2022-11-11 | Disposition: A | Discharge status: Home / Self Care | Payer: 59 | Attending: Urology | Admitting: Urology

## 2022-11-11 ENCOUNTER — Encounter (HOSPITAL_BASED_OUTPATIENT_CLINIC_OR_DEPARTMENT_OTHER): Payer: Self-pay | Admitting: Urology

## 2022-11-11 ENCOUNTER — Encounter (HOSPITAL_BASED_OUTPATIENT_CLINIC_OR_DEPARTMENT_OTHER)
Admission: RE | Disposition: A | Discharge status: Home / Self Care | Payer: Self-pay | Source: Home / Self Care | Attending: Urology

## 2022-11-11 DIAGNOSIS — K219 Gastro-esophageal reflux disease without esophagitis: Secondary | ICD-10-CM | POA: Diagnosis not present

## 2022-11-11 DIAGNOSIS — N200 Calculus of kidney: Secondary | ICD-10-CM | POA: Diagnosis not present

## 2022-11-11 DIAGNOSIS — C679 Malignant neoplasm of bladder, unspecified: Secondary | ICD-10-CM | POA: Insufficient documentation

## 2022-11-11 DIAGNOSIS — Z90722 Acquired absence of ovaries, bilateral: Secondary | ICD-10-CM | POA: Diagnosis not present

## 2022-11-11 DIAGNOSIS — D136 Benign neoplasm of pancreas: Secondary | ICD-10-CM | POA: Diagnosis not present

## 2022-11-11 DIAGNOSIS — Z9049 Acquired absence of other specified parts of digestive tract: Secondary | ICD-10-CM | POA: Diagnosis not present

## 2022-11-11 DIAGNOSIS — Z01818 Encounter for other preprocedural examination: Secondary | ICD-10-CM

## 2022-11-11 HISTORY — PX: TRANSURETHRAL RESECTION OF BLADDER TUMOR: SHX2575

## 2022-11-11 HISTORY — DX: Malignant (primary) neoplasm, unspecified: C80.1

## 2022-11-11 HISTORY — PX: CYSTOSCOPY WITH RETROGRADE PYELOGRAM, URETEROSCOPY AND STENT PLACEMENT: SHX5789

## 2022-11-11 LAB — POCT I-STAT, CHEM 8
BUN: 23 mg/dL (ref 8–23)
Calcium, Ion: 1.13 mmol/L — ABNORMAL LOW (ref 1.15–1.40)
Chloride: 105 mmol/L (ref 98–111)
Creatinine, Ser: 0.7 mg/dL (ref 0.44–1.00)
Glucose, Bld: 97 mg/dL (ref 70–99)
HCT: 44 % (ref 36.0–46.0)
Hemoglobin: 15 g/dL (ref 12.0–15.0)
Potassium: 3.6 mmol/L (ref 3.5–5.1)
Sodium: 142 mmol/L (ref 135–145)
TCO2: 26 mmol/L (ref 22–32)

## 2022-11-11 SURGERY — TURBT (TRANSURETHRAL RESECTION OF BLADDER TUMOR)
Anesthesia: General | Site: Ureter

## 2022-11-11 MED ORDER — FENTANYL CITRATE (PF) 100 MCG/2ML IJ SOLN
INTRAMUSCULAR | Status: DC | PRN
  Administered 2022-11-11 (×4): 25 ug via INTRAVENOUS

## 2022-11-11 MED ORDER — ACETAMINOPHEN 500 MG PO TABS
1000.0000 mg | ORAL_TABLET | Freq: Once | ORAL | Status: AC
  Administered 2022-11-11: 1000 mg via ORAL

## 2022-11-11 MED ORDER — DEXAMETHASONE SODIUM PHOSPHATE 4 MG/ML IJ SOLN
INTRAMUSCULAR | Status: DC | PRN
  Administered 2022-11-11: 5 mg via INTRAVENOUS

## 2022-11-11 MED ORDER — CEPHALEXIN 500 MG PO CAPS: 500.0000 mg | ORAL_CAPSULE | Freq: Two times a day (BID) | ORAL | 0 refills | Status: AC

## 2022-11-11 MED ORDER — OXYCODONE HCL 5 MG PO TABS
ORAL_TABLET | ORAL | Status: AC
  Filled 2022-11-11: qty 1

## 2022-11-11 MED ORDER — FENTANYL CITRATE (PF) 100 MCG/2ML IJ SOLN: 25.0000 ug | INTRAMUSCULAR | Status: DC | PRN

## 2022-11-11 MED ORDER — LIDOCAINE HCL (PF) 2 % IJ SOLN
INTRAMUSCULAR | Status: AC
  Filled 2022-11-11: qty 5

## 2022-11-11 MED ORDER — SODIUM CHLORIDE 0.9 % IR SOLN
Status: DC | PRN
  Administered 2022-11-11 (×2): 3000 mL

## 2022-11-11 MED ORDER — FENTANYL CITRATE (PF) 100 MCG/2ML IJ SOLN
INTRAMUSCULAR | Status: AC
  Filled 2022-11-11: qty 2

## 2022-11-11 MED ORDER — PROPOFOL 10 MG/ML IV BOLUS
INTRAVENOUS | Status: AC
  Filled 2022-11-11: qty 20

## 2022-11-11 MED ORDER — OXYCODONE HCL 5 MG/5ML PO SOLN: 5.0000 mg | Freq: Once | ORAL | Status: AC | PRN

## 2022-11-11 MED ORDER — IOHEXOL 300 MG/ML  SOLN
INTRAMUSCULAR | Status: DC | PRN
  Administered 2022-11-11: 10 mL via URETHRAL

## 2022-11-11 MED ORDER — DEXAMETHASONE SODIUM PHOSPHATE 10 MG/ML IJ SOLN
INTRAMUSCULAR | Status: AC
  Filled 2022-11-11: qty 1

## 2022-11-11 MED ORDER — PROMETHAZINE HCL 25 MG/ML IJ SOLN
INTRAMUSCULAR | Status: AC
  Filled 2022-11-11: qty 1

## 2022-11-11 MED ORDER — ONDANSETRON HCL 4 MG/2ML IJ SOLN
INTRAMUSCULAR | Status: DC | PRN
  Administered 2022-11-11: 4 mg via INTRAVENOUS

## 2022-11-11 MED ORDER — LACTATED RINGERS IV SOLN
INTRAVENOUS | Status: DC
  Administered 2022-11-11: 1000 mL via INTRAVENOUS

## 2022-11-11 MED ORDER — 0.9 % SODIUM CHLORIDE (POUR BTL) OPTIME
TOPICAL | Status: DC | PRN
  Administered 2022-11-11: 500 mL

## 2022-11-11 MED ORDER — ONDANSETRON HCL 4 MG/2ML IJ SOLN
INTRAMUSCULAR | Status: AC
  Filled 2022-11-11: qty 2

## 2022-11-11 MED ORDER — PROPOFOL 10 MG/ML IV BOLUS
INTRAVENOUS | Status: DC | PRN
  Administered 2022-11-11: 50 mg via INTRAVENOUS
  Administered 2022-11-11: 150 mg via INTRAVENOUS

## 2022-11-11 MED ORDER — AMISULPRIDE (ANTIEMETIC) 5 MG/2ML IV SOLN
INTRAVENOUS | Status: AC
  Filled 2022-11-11: qty 4

## 2022-11-11 MED ORDER — MIDAZOLAM HCL 2 MG/2ML IJ SOLN
INTRAMUSCULAR | Status: AC
  Filled 2022-11-11: qty 2

## 2022-11-11 MED ORDER — OXYCODONE-ACETAMINOPHEN 5-325 MG PO TABS: 1.0000 | ORAL_TABLET | Freq: Four times a day (QID) | ORAL | 0 refills | Status: AC | PRN

## 2022-11-11 MED ORDER — OXYCODONE HCL 5 MG PO TABS
5.0000 mg | ORAL_TABLET | Freq: Once | ORAL | Status: AC | PRN
  Administered 2022-11-11: 5 mg via ORAL

## 2022-11-11 MED ORDER — LIDOCAINE HCL (CARDIAC) PF 100 MG/5ML IV SOSY
PREFILLED_SYRINGE | INTRAVENOUS | Status: DC | PRN
  Administered 2022-11-11: 60 mg via INTRAVENOUS

## 2022-11-11 MED ORDER — GENTAMICIN SULFATE 40 MG/ML IJ SOLN
5.0000 mg/kg | INTRAVENOUS | Status: AC
  Administered 2022-11-11: 249.6 mg via INTRAVENOUS
  Filled 2022-11-11: qty 6.25

## 2022-11-11 MED ORDER — PROMETHAZINE HCL 25 MG/ML IJ SOLN
6.2500 mg | INTRAMUSCULAR | Status: DC | PRN
  Administered 2022-11-11: 12.5 mg via INTRAVENOUS

## 2022-11-11 MED ORDER — ACETAMINOPHEN 500 MG PO TABS
ORAL_TABLET | ORAL | Status: AC
  Filled 2022-11-11: qty 2

## 2022-11-11 MED ORDER — ROCURONIUM BROMIDE 10 MG/ML (PF) SYRINGE
PREFILLED_SYRINGE | INTRAVENOUS | Status: AC
  Filled 2022-11-11: qty 10

## 2022-11-11 MED ORDER — AMISULPRIDE (ANTIEMETIC) 5 MG/2ML IV SOLN
10.0000 mg | Freq: Once | INTRAVENOUS | Status: AC
  Administered 2022-11-11: 10 mg via INTRAVENOUS

## 2022-11-11 MED ORDER — PHENYLEPHRINE HCL (PRESSORS) 10 MG/ML IV SOLN
INTRAVENOUS | Status: DC | PRN
  Administered 2022-11-11 (×3): 80 ug via INTRAVENOUS

## 2022-11-11 MED ORDER — MIDAZOLAM HCL 5 MG/5ML IJ SOLN
INTRAMUSCULAR | Status: DC | PRN
  Administered 2022-11-11: 2 mg via INTRAVENOUS

## 2022-11-11 MED ORDER — SENNOSIDES-DOCUSATE SODIUM 8.6-50 MG PO TABS: 1.0000 | ORAL_TABLET | Freq: Two times a day (BID) | ORAL | 0 refills | Status: AC

## 2022-11-11 SURGICAL SUPPLY — 27 items
BAG DRAIN URO-CYSTO SKYTR STRL (DRAIN) ×2 IMPLANT
BAG DRN UROCATH (DRAIN) ×2
CATH FOLEY 2WAY SLVR 30CC 20FR (CATHETERS) IMPLANT
CATH FOLEY 2WAY SLVR 5CC 22FR (CATHETERS) IMPLANT
CATH URETL OPEN END 6FR 70 (CATHETERS) IMPLANT
CLOTH BEACON ORANGE TIMEOUT ST (SAFETY) ×2 IMPLANT
ELECT REM PT RETURN 9FT ADLT (ELECTROSURGICAL) ×2
ELECTRODE REM PT RTRN 9FT ADLT (ELECTROSURGICAL) ×2 IMPLANT
GLOVE BIO SURGEON STRL SZ 6.5 (GLOVE) IMPLANT
GLOVE BIO SURGEON STRL SZ7.5 (GLOVE) ×2 IMPLANT
GLOVE BIOGEL PI IND STRL 6.5 (GLOVE) IMPLANT
GOWN STRL REUS W/ TWL LRG LVL3 (GOWN DISPOSABLE) IMPLANT
GOWN STRL REUS W/TWL LRG LVL3 (GOWN DISPOSABLE) ×4 IMPLANT
GUIDEWIRE ANG ZIPWIRE 038X150 (WIRE) ×2 IMPLANT
GUIDEWIRE STR DUAL SENSOR (WIRE) ×2 IMPLANT
IV NS IRRIG 3000ML ARTHROMATIC (IV SOLUTION) ×2 IMPLANT
KIT TURNOVER CYSTO (KITS) ×2 IMPLANT
LOOP CUT BIPOLAR 24F LRG (ELECTROSURGICAL) IMPLANT
MANIFOLD NEPTUNE II (INSTRUMENTS) ×2 IMPLANT
NS IRRIG 500ML POUR BTL (IV SOLUTION) ×2 IMPLANT
PACK CYSTO (CUSTOM PROCEDURE TRAY) ×2 IMPLANT
SLEEVE SCD COMPRESS KNEE MED (STOCKING) ×2 IMPLANT
STENT POLARIS 5FRX22 (STENTS) IMPLANT
SYR 10ML LL (SYRINGE) ×2 IMPLANT
TUBE CONNECTING 12X1/4 (SUCTIONS) ×2 IMPLANT
TUBE PU 8FR 16IN ENFIT (TUBING) IMPLANT
TUBING UROLOGY SET (TUBING) ×2 IMPLANT

## 2022-11-11 NOTE — Brief Op Note (Signed)
11/11/2022  8:03 AM  PATIENT:  Casey Bauer  62 y.o. female  PRE-OPERATIVE DIAGNOSIS:  BLADDER TUMOR LEFT RENAL STONE  POST-OPERATIVE DIAGNOSIS:  BLADDER TUMOR LEFT RENAL STONE  PROCEDURE:  Procedure(s): TRANSURETHRAL RESECTION OF BLADDER TUMOR (TURBT) (N/A) CYSTOSCOPY WITH BILATERAL RETROGRADE LEFT DIAGNOSTIC URETEROSCOPY AND STENT PLACEMENT (Bilateral)  SURGEON:  Surgeon(s) and Role:    * Aurilla Coulibaly, Delbert Phenix., MD - Primary  PHYSICIAN ASSISTANT:   ASSISTANTS: none   ANESTHESIA:   general  EBL:  minimal   BLOOD ADMINISTERED:none  DRAINS: none   LOCAL MEDICATIONS USED:  NONE  SPECIMEN:  Source of Specimen:  bladder tumor  DISPOSITION OF SPECIMEN:  PATHOLOGY  COUNTS:  YES  TOURNIQUET:  * No tourniquets in log *  DICTATION: .Other Dictation: Dictation Number [id number]  PLAN OF CARE: Discharge to home after PACU  PATIENT DISPOSITION:  PACU - hemodynamically stable.   Delay start of Pharmacological VTE agent (>24hrs) due to surgical blood loss or risk of bleeding: yes

## 2022-11-11 NOTE — Op Note (Signed)
Casey Bauer, [name] MEDICAL RECORD NO: [id number] ACCOUNT NO: [id number] DATE OF BIRTH: 06/30/1960 FACILITY: [facility] LOCATION: [facility] PHYSICIAN: Sebastian Ache, MD  Operative Report   DATE OF PROCEDURE: 11/11/2022  PREOPERATIVE DIAGNOSES:  Small bladder tumor, left renal stone.  POSTOPERATIVE DIAGNOSES:  Small bladder tumor, left renal stone (left stone parenchymal).  PROCEDURE PERFORMED: 1.  Cystoscopy with bilateral retrograde pyelograms interpretation. 2.  Transurethral resection of bladder tumor, volume small. 3.  Left diagnostic ureteroscopy with insertion of left ureteral stent.  SPECIMEN:  Bladder tumor for permanent pathology.  FINDINGS:  1.  Small papillary bladder tumor lateral to the left ureteral orifice, 1.5 cm. 2.  Unremarkable bilateral retrograde pyelograms. 3.  Left renal stone parenchymal, not accessible via ureteroscopy. 4.  Successful placement of left ureteral stent, proximal end in the renal pelvis, distal end in urinary bladder.  INDICATIONS:  The patient is a 62 year old lady with history of recurrent urolithiasis.  She was found on workup of microscopic hematuria to have a left lower pole renal stone that appeared to be parenchymal in type but also a small papillary tumor of  the bladder.  Options were discussed for management including recommended path of transurethral resection for management of the small tumor and consideration of left ureteroscopy to further characterize the left renal stone and she wished to proceed.   Informed consent was obtained and placed in medical record.  PROCEDURE IN DETAIL:  The patient being verified, procedure being cystoscopy, bilateral retrograde, left ureteroscopy, transurethral resection of bladder tumor was confirmed.  Procedure timeout was performed.  Intravenous antibiotics were administered.   General LMA anesthesia induced.  The patient was placed into a low lithotomy position.  Sterile field was created, prepped and  draped the patient's vagina, introitus, and proximal thighs using iodine.  Cystourethroscopy was performed using 21-French  rigid cystoscope with offset lens.  Inspection of urinary bladder revealed no diverticula or calcifications. Ureteral orifices were single.  There was a small papillary tumor, approximately 1.5 cm in size just lateral to the left ureteral orifice.  Photodocumentation performed.  Cystoscope was exchanged for the resectoscope sheath with visual obturator and using resectoscope loop, very careful resection was performed of the small tumor, essentially one swipe was able to remove the tumor at its base  with what appeared to be very satisfactory base margin and this was irrigated and set aside for permanent pathology.  Base of this was fulgurated.  Given the very small nature of the tumor, I did not feel that deep sampling was warranted.  Attention was  directed at retrograde pyelograms.  The right ureteral orifice was cannulated with 6-French end-hole catheter, and right retrograde pyelogram was obtained.  Right retrograde pyelogram demonstrated single right ureter, single system right kidney.  No filling defects or narrowing noted.  Next, left retrograde pyelogram was obtained.  Left retrograde pyelogram demonstrated single left ureter, single system left kidney.  No filling defects or narrowing noted. ZIPwire was advanced to the level of the upper pole, set aside as a safety wire.  An 8-French feeding tube placed in the urinary  bladder for pressure release.  Semirigid ureteroscopy performed of the distal orifice of left ureter alongside a separate sensor working wire.  No mucosal abnormalities were found.  The left ureter was quite narrow in caliber, but did accommodate 2  wires plus semirigid scope.  Semirigid scope was then exchanged for a single channel flexible digital ureteroscope over the sensor working wire to the level of the kidney and  flexible digital ureteroscopy was  performed. Inspection of left kidney  including all calices x 2 revealed no evidence of intraluminal stones, whatsoever.  Very careful attention was placed to the lower mid pole area and again there was no evidence of intraluminal stone, whatsoever.  This corroborated parenchymal nature of  the left renal stone.  Ureteroscope was removed under continuous vision, no significant mucosal abnormalities were found.  Given the relatively narrow caliber of left ureter, I felt that brief interval stenting with tethered stent would be most  prudent.  As such, a new 4 x 22 Polaris type stent was carefully placed over the remaining safety wire using fluoroscopic guidance.  Good proximal and distal plane were noted.  Tether was left in place, trimmed to length, tucked per vagina and the  procedure was terminated.  The patient tolerated the procedure well, no immediate perioperative complications.  The patient was taken to postanesthesia care unit in stable condition.  Plan for discharge home.   SHW D: 11/11/2022 8:08:26 am T: 11/11/2022 9:10:00 am  JOB: [id number]/ [id number]

## 2022-11-11 NOTE — Anesthesia Procedure Notes (Signed)
Procedure Name: LMA Insertion Date/Time: 11/11/2022 7:32 AM  Performed by: Jessica Priest, CRNAPre-anesthesia Checklist: Patient identified, Emergency Drugs available, Suction available, Patient being monitored and Timeout performed Patient Re-evaluated:Patient Re-evaluated prior to induction Oxygen Delivery Method: Circle system utilized Preoxygenation: Pre-oxygenation with 100% oxygen Induction Type: IV induction Ventilation: Mask ventilation without difficulty LMA: LMA inserted LMA Size: 3.0 Number of attempts: 1 Airway Equipment and Method: Bite block Placement Confirmation: positive ETCO2, breath sounds checked- equal and bilateral and CO2 detector Tube secured with: Tape Dental Injury: Teeth and Oropharynx as per pre-operative assessment

## 2022-11-11 NOTE — Anesthesia Postprocedure Evaluation (Signed)
Anesthesia Post Note  Patient: Casey Bauer  Procedure(s) Performed: TRANSURETHRAL RESECTION OF BLADDER TUMOR (TURBT) (Bladder) CYSTOSCOPY WITH BILATERAL RETROGRADE LEFT DIAGNOSTIC URETEROSCOPY AND STENT PLACEMENT (Bilateral: Ureter)     Patient location during evaluation: PACU Anesthesia Type: General Level of consciousness: awake and alert Pain management: pain level controlled Vital Signs Assessment: post-procedure vital signs reviewed and stable Respiratory status: spontaneous breathing, nonlabored ventilation and respiratory function stable Cardiovascular status: stable and blood pressure returned to baseline Anesthetic complications: no   No notable events documented.  Last Vitals:  Vitals:   11/11/22 1000 11/11/22 1015  BP: 121/78 120/69  Pulse: 64 63  Resp: 15 15  Temp:  (!) 36.3 C  SpO2: 98% 96%    Last Pain:  Vitals:   11/11/22 1015  TempSrc:   PainSc: Asleep                 Beryle Lathe

## 2022-11-11 NOTE — H&P (Signed)
Casey Bauer is an 62 y.o. female.    Chief Complaint: Pre-OP TURBT and Left Ureteroscopy  HPI:   1 - Microscopic Hematuria / Small Bladder Neoplasm- blood on UA noted x seeral by GYN and other providers. No gross episodes. Non smoker. H/o urolithiasisturia CT 09/2022 with left renal parenchymal calc and stable pancreatic IMPN (on imaging x severeal since 2021). Cysto 09/2022 with small trigone area tumro with some dystrophic calcificaiton.   2 - Recurrent Urolithiasis -  Pre 2024 - URS and [facility] in [facility]  09/2022 - [facility] 2.3cm non-obstructing parenchymal calc   PMH sig for TAH/BSO (initial concern of neoplasm, but final path benign), lap chole. 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 Her GYN is Casey Honour DO wit [facility] who also takes care of her primary care needs.   Today " Casey Bauer " is seen to proceed with cysto, retrogrades, TURBT, left ureteroscopy. No interval fevers. Most recent UA without infectious parameters.    Past Medical History:  Diagnosis Date   Allergy    Anemia    Cancer (HCC)    Complication of anesthesia    slow to wake up   GERD (gastroesophageal reflux disease)    History of kidney stones    IBS (irritable bowel syndrome)    Osteopenia    Osteoporosis    STD (female)    Thyroid tumor, benign     Past Surgical History:  Procedure Laterality Date   ARM SURGERY Right    fracture   CHOLECYSTECTOMY N/A 10/12/2018   Procedure: LAPAROSCOPIC CHOLECYSTECTOMY;  Surgeon: Casey Loron, MD;  Location: [facility];  Service: General;  Laterality: N/A;  TAP BLOCK   COLONOSCOPY     ROBOTIC ASSISTED SALPINGO OOPHERECTOMY Right 02/11/2020   Procedure: XI ROBOTIC ASSISTED RIGHT SALPINGO OOPHORECTOMY;  Surgeon: Casey Birchwood, MD;  Location: [facility];  Service: Gynecology;  Laterality: Right;   ROBOTIC ASSISTED TOTAL HYSTERECTOMY WITH BILATERAL SALPINGO OOPHERECTOMY N/A 04/20/2021   Procedure: XI ROBOTIC ASSISTED TOTAL HYSTERECTOMY  WITH LEFT SALPINGO OOPHORECTOMY;  Surgeon: Casey Fila, MD;  Location: [facility];  Service: Gynecology;  Laterality: N/A;   UPPER GI ENDOSCOPY     WISDOM TOOTH EXTRACTION      Family History  Problem Relation Age of Onset   Gallbladder disease Mother    Cancer Mother        lung   COPD Father    Asthma Father    Heart failure Father    Gallbladder disease Sister    Bone cancer Paternal Grandmother    Gallbladder disease Sister    Colon polyps Neg Hx    Colon cancer Neg Hx    Esophageal cancer Neg Hx    Stomach cancer Neg Hx    Social History:  reports that she has never smoked. She has never used smokeless tobacco. She reports current alcohol use. She reports that she does not use drugs.  Allergies:  Allergies  Allergen Reactions   Wound Dressing Adhesive Dermatitis    Dermabond Skin Glue    Medications Prior to Admission  Medication Sig Dispense Refill   ibuprofen (ADVIL) 200 MG tablet Take 400 mg by mouth daily as needed for headache.     Multiple Vitamin (MULTIVITAMIN) tablet Take 1 tablet by mouth daily. Centrum silver for women     calcium carbonate (OSCAL) 1500 (600 Ca) MG TABS tablet Take 600 mg of elemental calcium by mouth daily with breakfast.  Results for orders placed or performed during the hospital encounter of 11/11/22 (from the past 48 hour(s))  I-STAT, chem 8     Status: Abnormal   Collection Time: 11/11/22  6:10 AM  Result Value Ref Range   Sodium 142 135 - 145 mmol/L   Potassium 3.6 3.5 - 5.1 mmol/L   Chloride 105 98 - 111 mmol/L   BUN 23 8 - 23 mg/dL   Creatinine, Ser 7.82 0.44 - 1.00 mg/dL   Glucose, Bld 97 70 - 99 mg/dL    Comment: Glucose reference range applies only to samples taken after fasting for at least 8 hours.   Calcium, Ion 1.13 (L) 1.15 - 1.40 mmol/L   TCO2 26 22 - 32 mmol/L   Hemoglobin 15.0 12.0 - 15.0 g/dL   HCT 95.6 21.3 - 08.6 %   No results found.  Review of Systems  Constitutional:  Negative for chills and  fever.  Genitourinary:  Positive for hematuria.  All other systems reviewed and are negative.   Blood pressure 116/63, pulse 74, temperature 98.2 F (36.8 C), temperature source Oral, resp. rate 16, height 5\' 1"  (1.549 m), weight 49.9 kg, SpO2 99 %. Physical Exam HENT:     Head: Normocephalic.     Mouth/Throat:     Mouth: Mucous membranes are moist.  Eyes:     Pupils: Pupils are equal, round, and reactive to light.  Cardiovascular:     Rate and Rhythm: Normal rate.  Pulmonary:     Effort: Pulmonary effort is normal.  Abdominal:     General: Abdomen is flat.  Genitourinary:    Comments: No CVAT at present Musculoskeletal:        General: Normal range of motion.  Skin:    General: Skin is warm.  Neurological:     General: No focal deficit present.     Mental Status: She is alert.  Psychiatric:        Mood and Affect: Mood normal.      Assessment/Plan  Proceed as planned with cysto, bilateral retrotrades, TURBT, left ureteroscopy. Risks, benefits, alternatives, expected peri-op course discussed previously and reiterated today.   Casey Bauer., MD 11/11/2022, 6:19 AM

## 2022-11-11 NOTE — Transfer of Care (Signed)
Immediate Anesthesia Transfer of Care Note  Patient: Casey Bauer  Procedure(s) Performed: Procedure(s): TRANSURETHRAL RESECTION OF BLADDER TUMOR (TURBT) (N/A) CYSTOSCOPY WITH BILATERAL RETROGRADE LEFT DIAGNOSTIC URETEROSCOPY AND STENT PLACEMENT (Bilateral)  Patient Location: PACU  Anesthesia Type:General  Level of Consciousness: awake  Airway & Oxygen Therapy: Patient Spontanous Breathing  Post-op Assessment: Report given to PACU RN  Post vital signs: stable  Vitals:   11/11/22 0815 11/11/22 0830  BP: 117/80 (!) 91/57  Pulse: 77 66  Resp: 18 20  Temp: 36.5 C   SpO2: 100% 91%    Complications: No apparent anesthesia complications

## 2022-11-11 NOTE — Discharge Instructions (Addendum)
1 - You may have urinary urgency (bladder spasms) and bloody urine on / off with stent in place. This is normal.  2 - Remove tethered stent on Monday morning at home by pulling on string, then blue-white plastic tubing, and discarding. Office is open Monday if any issues arise.   3 -Call MD or go to ER for fever >102, severe pain / nausea / vomiting not relieved by medications, or acute change in medical status  No Tylenol until after 12:00 p.m.

## 2022-11-14 ENCOUNTER — Encounter (HOSPITAL_BASED_OUTPATIENT_CLINIC_OR_DEPARTMENT_OTHER): Payer: Self-pay | Admitting: Urology

## 2022-11-14 LAB — SURGICAL PATHOLOGY

## 2023-07-24 DIAGNOSIS — Z01419 Encounter for gynecological examination (general) (routine) without abnormal findings: Secondary | ICD-10-CM | POA: Diagnosis not present

## 2023-07-24 DIAGNOSIS — Z6821 Body mass index (BMI) 21.0-21.9, adult: Secondary | ICD-10-CM | POA: Diagnosis not present

## 2023-08-21 DIAGNOSIS — C670 Malignant neoplasm of trigone of bladder: Secondary | ICD-10-CM | POA: Diagnosis not present

## 2023-09-05 DIAGNOSIS — Z1231 Encounter for screening mammogram for malignant neoplasm of breast: Secondary | ICD-10-CM | POA: Diagnosis not present

## 2023-11-21 DIAGNOSIS — C670 Malignant neoplasm of trigone of bladder: Secondary | ICD-10-CM | POA: Diagnosis not present
